# Patient Record
Sex: Female | Born: 1990 | Race: Black or African American | Hispanic: No | Marital: Single | Smoking: Current every day smoker
Health system: Southern US, Community
[De-identification: ages and names within clinical notes are randomized; demographics above are authoritative.]

## PROBLEM LIST (undated history)

## (undated) ENCOUNTER — Inpatient Hospital Stay (HOSPITAL_COMMUNITY): Payer: Self-pay

## (undated) DIAGNOSIS — R87629 Unspecified abnormal cytological findings in specimens from vagina: Secondary | ICD-10-CM

## (undated) DIAGNOSIS — D649 Anemia, unspecified: Secondary | ICD-10-CM

## (undated) HISTORY — DX: Unspecified abnormal cytological findings in specimens from vagina: R87.629

## (undated) HISTORY — PX: WISDOM TOOTH EXTRACTION: SHX21

---
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
## 2010-02-14 ENCOUNTER — Observation Stay: Payer: Self-pay | Admitting: Obstetrics and Gynecology

## 2010-02-16 ENCOUNTER — Inpatient Hospital Stay (HOSPITAL_COMMUNITY)
Admission: AD | Admit: 2010-02-16 | Discharge: 2010-02-18 | Payer: Self-pay | Source: Home / Self Care | Attending: Obstetrics and Gynecology | Admitting: Obstetrics and Gynecology

## 2010-05-04 LAB — CBC
HCT: 26.5 % — ABNORMAL LOW (ref 36.0–46.0)
Hemoglobin: 8.2 g/dL — ABNORMAL LOW (ref 12.0–15.0)
MCH: 23.8 pg — ABNORMAL LOW (ref 26.0–34.0)
MCHC: 30.9 g/dL (ref 30.0–36.0)
MCV: 76.8 fL — ABNORMAL LOW (ref 78.0–100.0)
Platelets: 212 10*3/uL (ref 150–400)
RBC: 3.45 MIL/uL — ABNORMAL LOW (ref 3.87–5.11)
RDW: 17.4 % — ABNORMAL HIGH (ref 11.5–15.5)
WBC: 9.3 10*3/uL (ref 4.0–10.5)

## 2010-05-04 LAB — RPR: RPR Ser Ql: NONREACTIVE

## 2010-05-17 LAB — URINALYSIS, ROUTINE W REFLEX MICROSCOPIC
Bilirubin Urine: NEGATIVE
Glucose, UA: 100 mg/dL — AB
Nitrite: NEGATIVE
Protein, ur: 100 mg/dL — AB
Specific Gravity, Urine: >1.03 — ABNORMAL HIGH (ref 1.005–1.030)
Urobilinogen, UA: 0.2 mg/dL (ref 0.0–1.0)
pH: 7 (ref 5.0–8.0)

## 2010-05-17 LAB — URINE MICROSCOPIC-ADD ON

## 2010-05-17 LAB — PREGNANCY, URINE: Preg Test, Ur: NEGATIVE

## 2010-10-20 ENCOUNTER — Emergency Department (HOSPITAL_COMMUNITY)
Admission: EM | Admit: 2010-10-20 | Discharge: 2010-10-20 | Disposition: A | Discharge status: Home / Self Care | Payer: Medicaid Other | Attending: Emergency Medicine | Admitting: Emergency Medicine

## 2010-10-20 DIAGNOSIS — F17200 Nicotine dependence, unspecified, uncomplicated: Secondary | ICD-10-CM | POA: Insufficient documentation

## 2010-10-20 DIAGNOSIS — H109 Unspecified conjunctivitis: Secondary | ICD-10-CM | POA: Insufficient documentation

## 2010-10-20 MED ORDER — HYDROCODONE-ACETAMINOPHEN 5-325 MG PO TABS: ORAL_TABLET | ORAL | Status: DC

## 2010-10-20 MED ORDER — TOBRAMYCIN 0.3 % OP SOLN
2.0000 [drp] | Freq: Four times a day (QID) | OPHTHALMIC | Status: DC
  Administered 2010-10-20: 2 [drp] via OPHTHALMIC
  Filled 2010-10-20: qty 5

## 2010-10-20 NOTE — ED Provider Notes (Signed)
Medical screening examination/treatment/procedure(s) were performed by non-physician practitioner and as supervising physician I was immediately available for consultation/collaboration.   Kathleen M McManus, DO 10/20/10 2211 

## 2010-10-20 NOTE — ED Notes (Signed)
Pain and swelling in left eye

## 2010-10-20 NOTE — ED Provider Notes (Signed)
History     CSN: [id number] Arrival date & time: 10/20/2010  6:05 PM  Chief Complaint  Patient presents with  . Eye Problem   Patient is a 20 y.o. female presenting with eye problem. The history is provided by the patient. No language interpreter was used.  Eye Problem  This is a new problem. The problem occurs constantly. The problem has been gradually worsening. There is pain in the left eye. There was no injury mechanism. The pain is mild. There is no history of trauma to the eye. There is no known exposure to pink eye. She does not wear contacts. Associated symptoms include blurred vision, discharge and eye redness.    History reviewed. No pertinent past medical history.  History reviewed. No pertinent past surgical history.  History reviewed. No pertinent family history.  History  Substance Use Topics  . Smoking status: Current Everyday Smoker  . Smokeless tobacco: Not on file  . Alcohol Use: No    OB History    Grav Para Term Preterm Abortions TAB SAB Ect Mult Living                  Review of Systems  Constitutional: Positive for fever.  Eyes: Positive for blurred vision, discharge and redness.  All other systems reviewed and are negative.    Physical Exam  BP 105/62  Pulse 82  Temp(Src) 98.7 F (37.1 C) (Oral)  Resp 16  Ht 5\' 6"  (1.676 m)  Wt 155 lb (70.308 kg)  BMI 25.02 kg/m2  SpO2 100%  LMP 09/30/2010  Physical Exam  Nursing note and vitals reviewed. Constitutional: She is oriented to person, place, and time. Vital signs are normal. She appears well-developed and well-nourished. No distress.  HENT:  Head: Normocephalic and atraumatic.  Right Ear: External ear normal.  Left Ear: External ear normal.  Nose: Nose normal.  Mouth/Throat: No oropharyngeal exudate.  Eyes: EOM are normal. Pupils are equal, round, and reactive to light. Right eye exhibits no discharge. Left eye exhibits discharge. No scleral icterus.       Scleral injection in L eye.    Neck: Normal range of motion. Neck supple. No JVD present. No tracheal deviation present. No thyromegaly present.  Cardiovascular: Normal rate, regular rhythm, normal heart sounds, intact distal pulses and normal pulses.  Exam reveals no gallop and no friction rub.   No murmur heard. Pulmonary/Chest: Effort normal and breath sounds normal. No stridor. No respiratory distress. She has no wheezes. She has no rales. She exhibits no tenderness.  Abdominal: Soft. Normal appearance and bowel sounds are normal. She exhibits no distension and no mass. There is no tenderness. There is no rebound and no guarding.  Musculoskeletal: Normal range of motion. She exhibits no edema and no tenderness.  Lymphadenopathy:    She has no cervical adenopathy.  Neurological: She is alert and oriented to person, place, and time. She has normal reflexes. No cranial nerve deficit. Coordination normal. GCS eye subscore is 4. GCS verbal subscore is 5. GCS motor subscore is 6.  Skin: Skin is warm and dry. No rash noted. She is not diaphoretic.  Psychiatric: She has a normal mood and affect. Her speech is normal and behavior is normal. Judgment and thought content normal. Cognition and memory are normal.    ED Course  Procedures  MDM no R eye involvement.      Worthy Rancher, PA 10/20/10 [phone]

## 2010-11-18 LAB — CBC
HCT: 33.9 — ABNORMAL LOW
MCHC: 34.6
MCV: 85.8
RBC: 3.95
WBC: 4.9

## 2010-11-18 LAB — DIFFERENTIAL
Basophils Absolute: 0.1
Eosinophils Relative: 1
Lymphocytes Relative: 45
Lymphs Abs: 2.2
Neutro Abs: 1.9
Neutrophils Relative %: 39 — ABNORMAL LOW

## 2010-11-18 LAB — URINALYSIS, ROUTINE W REFLEX MICROSCOPIC
Bilirubin Urine: NEGATIVE
Glucose, UA: NEGATIVE
Hgb urine dipstick: NEGATIVE
Specific Gravity, Urine: 1.02
Urobilinogen, UA: 0.2

## 2010-11-18 LAB — COMPREHENSIVE METABOLIC PANEL
BUN: 7
CO2: 27
Calcium: 9.4
Chloride: 105
Creatinine, Ser: 0.74
Glucose, Bld: 100 — ABNORMAL HIGH
Total Bilirubin: 0.6

## 2010-11-18 LAB — LIPASE, BLOOD: Lipase: 20

## 2011-08-28 ENCOUNTER — Encounter (HOSPITAL_COMMUNITY): Payer: Self-pay | Admitting: *Deleted

## 2011-08-28 ENCOUNTER — Emergency Department (HOSPITAL_COMMUNITY)
Admission: EM | Admit: 2011-08-28 | Discharge: 2011-08-28 | Disposition: A | Discharge status: Home / Self Care | Payer: Medicaid Other | Attending: Emergency Medicine | Admitting: Emergency Medicine

## 2011-08-28 ENCOUNTER — Emergency Department (HOSPITAL_COMMUNITY): Payer: Medicaid Other

## 2011-08-28 DIAGNOSIS — S46912A Strain of unspecified muscle, fascia and tendon at shoulder and upper arm level, left arm, initial encounter: Secondary | ICD-10-CM

## 2011-08-28 DIAGNOSIS — M25519 Pain in unspecified shoulder: Secondary | ICD-10-CM | POA: Insufficient documentation

## 2011-08-28 DIAGNOSIS — F17200 Nicotine dependence, unspecified, uncomplicated: Secondary | ICD-10-CM | POA: Insufficient documentation

## 2011-08-28 MED ORDER — HYDROCODONE-ACETAMINOPHEN 5-325 MG PO TABS: 1.0000 | ORAL_TABLET | Freq: Four times a day (QID) | ORAL | Status: AC | PRN

## 2011-08-28 MED ORDER — HYDROCODONE-ACETAMINOPHEN 5-325 MG PO TABS: 20.0000 | ORAL_TABLET | Freq: Four times a day (QID) | ORAL | Status: DC | PRN

## 2011-08-28 NOTE — ED Notes (Addendum)
Pt. Presents w/c/o L posterior shoulder pain, radiating around scapula to axilla at a 6/10.  Began spontaneously on Sunday evening. Pain was a 10 at that point.  She took 3 Extra Strength Tylenol w/out relief.  Since that time however, pain has lessened but been unrelenting.  Pt. Cannot associate it w/any particular injury or movement.

## 2011-08-28 NOTE — ED Notes (Signed)
Left shoulder pain since last Sunday night. Hurst worse with certain position changes of the arm. No known injury

## 2011-08-28 NOTE — ED Notes (Signed)
Patient with no complaints at this time. Respirations even and unlabored. Skin warm/dry. Discharge instructions reviewed with patient at this time. Patient given opportunity to voice concerns/ask questions. Patient discharged at this time and left Emergency Department with steady gait.   

## 2011-08-28 NOTE — ED Provider Notes (Signed)
History     CSN: [id number]  Arrival date & time 08/28/11  1226   First MD Initiated Contact with Patient 08/28/11 1418      Chief Complaint  Patient presents with  . Shoulder Pain    (Consider location/radiation/quality/duration/timing/severity/associated sxs/prior treatment) HPI Comments: L shoulder pain x 6 days.  No known injury.    R hand dominant.  The history is provided by the patient. No language interpreter was used.    History reviewed. No pertinent past medical history.  History reviewed. No pertinent past surgical history.  No family history on file.  History  Substance Use Topics  . Smoking status: Current Everyday Smoker  . Smokeless tobacco: Not on file  . Alcohol Use: No    OB History    Grav Para Term Preterm Abortions TAB SAB Ect Mult Living                  Review of Systems  Constitutional: Negative for fever and chills.  Musculoskeletal:       Shoulder pain  Neurological: Negative for weakness and numbness.  All other systems reviewed and are negative.    Allergies  Review of patient's allergies indicates no known allergies.  Home Medications   Current Outpatient Rx  Name Route Sig Dispense Refill  . HYDROCODONE-ACETAMINOPHEN 5-325 MG PO TABS  One po q 4-6 hrs prn pain 20 tablet 0    BP 99/56  Pulse 105  Temp 98.3 F (36.8 C)  Resp 16  SpO2 100%  LMP 08/20/2011  Physical Exam  Nursing note and vitals reviewed. Constitutional: She is oriented to person, place, and time. She appears well-developed and well-nourished. No distress.  HENT:  Head: Normocephalic and atraumatic.  Eyes: EOM are normal.  Neck: Normal range of motion.  Cardiovascular: Normal rate, regular rhythm and normal heart sounds.   Pulmonary/Chest: Effort normal and breath sounds normal.  Abdominal: Soft. She exhibits no distension. There is no tenderness.  Musculoskeletal: She exhibits tenderness.       Left shoulder: She exhibits decreased range of  motion, tenderness, bony tenderness and pain. She exhibits no swelling, no effusion, no crepitus, no deformity, no laceration, no spasm and normal strength.       Arms: Neurological: She is alert and oriented to person, place, and time.  Skin: Skin is warm and dry.  Psychiatric: She has a normal mood and affect. Judgment normal.    ED Course  Procedures (including critical care time)  Labs Reviewed - No data to display No results found.   No diagnosis found.    MDM  Normal x-rays.  Sling, ice elevation.  Follow up with your PCP as needed.        Evalina Field, [place] 08/28/11 [phone]

## 2011-08-28 NOTE — ED Provider Notes (Signed)
Medical screening examination/treatment/procedure(s) were performed by non-physician practitioner and as supervising physician I was immediately available for consultation/collaboration.   Laray Anger, DO 08/28/11 2039

## 2013-03-01 ENCOUNTER — Emergency Department (HOSPITAL_COMMUNITY)
Admission: EM | Admit: 2013-03-01 | Discharge: 2013-03-01 | Disposition: A | Discharge status: Home / Self Care | Payer: Medicaid Other | Attending: Emergency Medicine | Admitting: Emergency Medicine

## 2013-03-01 ENCOUNTER — Encounter (HOSPITAL_COMMUNITY): Payer: Self-pay | Admitting: Emergency Medicine

## 2013-03-01 DIAGNOSIS — F17200 Nicotine dependence, unspecified, uncomplicated: Secondary | ICD-10-CM | POA: Insufficient documentation

## 2013-03-01 DIAGNOSIS — IMO0001 Reserved for inherently not codable concepts without codable children: Secondary | ICD-10-CM | POA: Insufficient documentation

## 2013-03-01 DIAGNOSIS — J069 Acute upper respiratory infection, unspecified: Secondary | ICD-10-CM | POA: Insufficient documentation

## 2013-03-01 DIAGNOSIS — J039 Acute tonsillitis, unspecified: Secondary | ICD-10-CM | POA: Insufficient documentation

## 2013-03-01 LAB — RAPID STREP SCREEN (MED CTR MEBANE ONLY): Streptococcus, Group A Screen (Direct): NEGATIVE

## 2013-03-01 MED ORDER — MAGIC MOUTHWASH W/LIDOCAINE: 5.0000 mL | Freq: Three times a day (TID) | ORAL | Status: DC | PRN

## 2013-03-01 MED ORDER — AMOXICILLIN 500 MG PO CAPS: 500.0000 mg | ORAL_CAPSULE | Freq: Three times a day (TID) | ORAL | Status: DC

## 2013-03-01 MED ORDER — IBUPROFEN 800 MG PO TABS: 800.0000 mg | ORAL_TABLET | Freq: Three times a day (TID) | ORAL | Status: DC

## 2013-03-01 MED ORDER — IBUPROFEN 800 MG PO TABS
800.0000 mg | ORAL_TABLET | Freq: Once | ORAL | Status: AC
  Administered 2013-03-01: 800 mg via ORAL
  Filled 2013-03-01: qty 1

## 2013-03-01 MED ORDER — GUAIFENESIN-CODEINE 100-10 MG/5ML PO SOLN
10.0000 mL | Freq: Once | ORAL | Status: AC
  Administered 2013-03-01: 10 mL via ORAL
  Filled 2013-03-01: qty 10

## 2013-03-01 NOTE — Discharge Instructions (Signed)
Tonsillitis Tonsillitis is an infection of the throat. This infection causes the tonsils to become red, tender, and puffy (swollen). Tonsils are groups of tissue at the back of your throat. If bacteria caused your infection, antibiotic medicine will be given to you. Sometimes symptoms of tonsillitis can be relieved with the use of steroid medicine. If your tonsillitis is severe and happens often, you may need to get your tonsils removed (tonsillectomy). HOME CARE   Rest and sleep often.  Drink enough fluids to keep your pee (urine) clear or pale yellow.  While your throat is sore, eat soft or liquid foods like:  Soup.  Ice cream.  Instant breakfast drinks.  Eat frozen ice pops.  Gargle with a warm or cold liquid to help soothe the throat. Gargle with a water and salt mix. Mix 1/4 teaspoon of salt and 1/4 teaspoon of baking soda in 1 cup of water.  Only take medicines as told by your doctor.  If you are given medicines (antibiotics), take them as told. Finish them even if you start to feel better. GET HELP RIGHT AWAY IF:   You throw up (vomit).  You have a very bad headache.  You have a stiff neck.  You have chest pain.  You have trouble breathing or swallowing.  You have bad throat pain, drooling, or your voice changes.  You have bad pain not helped by medicine.  You cannot fully open your mouth.  You have redness, puffiness, or bad pain in the neck.  You have a fever.  You have a rash.  You cough up green, yellow-brown, or bloody fluid.  You cannot swallow liquids or food for 24 hours.  You notice that only one of your tonsils is swollen. MAKE SURE YOU:   Understand these instructions.  Will watch your condition.  Will get help right away if you are not doing well or get worse. Document Released: 07/28/2007 Document Revised: 10/11/2012 Document Reviewed: 07/28/2012 [facility] Patient Information 2014 [place], [place].  Upper Respiratory Infection, Adult An  upper respiratory infection (URI) is also known as the common cold. It is often caused by a type of germ (virus). Colds are easily spread (contagious). You can pass it to others by kissing, coughing, sneezing, or drinking out of the same glass. Usually, you get better in 1 or 2 weeks.  HOME CARE   Only take medicine as told by your doctor.  Use a warm mist humidifier or breathe in steam from a hot shower.  Drink enough water and fluids to keep your pee (urine) clear or pale yellow.  Get plenty of rest.  Return to work when your temperature is back to normal or as told by your doctor. You may use a face mask and wash your hands to stop your cold from spreading. GET HELP RIGHT AWAY IF:   After the first few days, you feel you are getting worse.  You have questions about your medicine.  You have chills, shortness of breath, or brown or red spit (mucus).  You have yellow or brown snot (nasal discharge) or pain in the face, especially when you bend forward.  You have a fever, puffy (swollen) neck, pain when you swallow, or white spots in the back of your throat.  You have a bad headache, ear pain, sinus pain, or chest pain.  You have a high-pitched whistling sound when you breathe in and out (wheezing).  You have a lasting cough or cough up blood.  You have sore  muscles or a stiff neck. MAKE SURE YOU:   Understand these instructions.  Will watch your condition.  Will get help right away if you are not doing well or get worse. Document Released: 07/28/2007 Document Revised: 05/03/2011 Document Reviewed: 06/15/2010 [facility] Patient Information 2014 [place], [place].

## 2013-03-01 NOTE — ED Provider Notes (Signed)
CSN: [id number]     Arrival date & time 03/01/13  1426 History   First MD Initiated Contact with Patient 03/01/13 1439     Chief Complaint  Patient presents with  . Sore Throat   (Consider location/radiation/quality/duration/timing/severity/associated sxs/prior Treatment) Patient is a 23 y.o. female presenting with pharyngitis. The history is provided by the patient.  Sore Throat This is a new problem. Episode onset: 2 days ago. The problem occurs constantly. The problem has been unchanged. Associated symptoms include chills, congestion, a fever, myalgias, a sore throat and swollen glands. Pertinent negatives include no abdominal pain, arthralgias, change in bowel habit, chest pain, coughing, headaches, joint swelling, nausea, neck pain, numbness, rash, urinary symptoms, vomiting or weakness. The symptoms are aggravated by swallowing. She has tried nothing for the symptoms. The treatment provided no relief.    History reviewed. No pertinent past medical history. History reviewed. No pertinent past surgical history. History reviewed. No pertinent family history. History  Substance Use Topics  . Smoking status: Current Every Day Smoker    Types: Cigarettes  . Smokeless tobacco: Not on file  . Alcohol Use: No   OB History   Grav Para Term Preterm Abortions TAB SAB Ect Mult Living                 Review of Systems  Constitutional: Positive for fever and chills. Negative for activity change and appetite change.  HENT: Positive for congestion and sore throat. Negative for rhinorrhea and voice change.   Eyes: Negative for visual disturbance.  Respiratory: Negative for cough and chest tightness.   Cardiovascular: Negative for chest pain.  Gastrointestinal: Negative for nausea, vomiting, abdominal pain and change in bowel habit.  Musculoskeletal: Positive for myalgias. Negative for arthralgias, joint swelling and neck pain.  Skin: Negative for rash.  Neurological: Negative for dizziness,  syncope, weakness, numbness and headaches.  Hematological: Positive for adenopathy.  All other systems reviewed and are negative.    Allergies  Review of patient's allergies indicates no known allergies.  Home Medications   Current Outpatient Rx  Name  Route  Sig  Dispense  Refill  . DM-Doxylamine-Acetaminophen (NYQUIL COLD & FLU PO)   Oral   Take 2 capsules by mouth every 6 (six) hours as needed. Cold symptoms         . Alum & Mag Hydroxide-Simeth (MAGIC MOUTHWASH W/LIDOCAINE) SOLN   Oral   Take 5 mLs by mouth 3 (three) times daily as needed for mouth pain. Swish and spit , do not swallow   100 mL   0   . amoxicillin (AMOXIL) 500 MG capsule   Oral   Take 1 capsule (500 mg total) by mouth 3 (three) times daily. For 10 days   30 capsule   0   . ibuprofen (ADVIL,MOTRIN) 800 MG tablet   Oral   Take 1 tablet (800 mg total) by mouth 3 (three) times daily.   21 tablet   0    BP 108/69  Pulse 105  Temp(Src) 98.2 F (36.8 C) (Oral)  Resp 18  Ht 5\' 6"  (1.676 m)  Wt 125 lb (56.7 kg)  BMI 20.19 kg/m2  SpO2 97%  LMP 02/17/2013   Physical Exam  Nursing note and vitals reviewed. Constitutional: She is oriented to person, place, and time. She appears well-developed and well-nourished. No distress.  HENT:  Head: Normocephalic and atraumatic.  Right Ear: Tympanic membrane and ear canal normal.  Left Ear: Tympanic membrane and ear canal normal.  Nose: Mucosal edema and rhinorrhea present.  Mouth/Throat: Uvula is midline and mucous membranes are normal. No uvula swelling. Posterior oropharyngeal edema and posterior oropharyngeal erythema present. No tonsillar abscesses.  Several scattered vesicular lesions to the oropharynx. Mild erythema.  Uvula midline.  No PTA  Neck: Normal range of motion. Neck supple.  Cardiovascular: Normal rate, regular rhythm, normal heart sounds and intact distal pulses.   No murmur heard. Pulmonary/Chest: Effort normal and breath sounds normal.  No respiratory distress. She has no wheezes. She has no rales. She exhibits no tenderness.  Musculoskeletal: Normal range of motion. She exhibits no edema.  Lymphadenopathy:    She has cervical adenopathy.  Neurological: She is alert and oriented to person, place, and time. She exhibits normal muscle tone. Coordination normal.  Skin: Skin is warm and dry. No rash noted.    ED Course  Procedures (including critical care time) Labs Review Labs Reviewed  RAPID STREP SCREEN  CULTURE, GROUP A STREP   Imaging Review No results found.  EKG Interpretation   None       MDM   1. Tonsillitis   2. URI (upper respiratory infection)    Pt is well appearing, handles secretions well.  No PTA.  Will treat for tonsillitis with amoxil, ibuprofen and magic mouthwash.  Pt agrees to care plan and appears stable for discharge.      Myasia Sinatra L. Trisha Mangle, PA-C 03/01/13 1604

## 2013-03-01 NOTE — ED Notes (Signed)
Cough, np,  Sore throat, nasal congestion.  No NVD

## 2013-03-03 LAB — CULTURE, GROUP A STREP

## 2013-03-10 NOTE — ED Provider Notes (Signed)
Medical screening examination/treatment/procedure(s) were performed by non-physician practitioner and as supervising physician I was immediately available for consultation/collaboration.  EKG Interpretation   None         Angelino Rumery, MD 03/10/13 0652 

## 2013-05-27 ENCOUNTER — Emergency Department (HOSPITAL_COMMUNITY)
Admission: EM | Admit: 2013-05-27 | Discharge: 2013-05-27 | Disposition: A | Discharge status: Home / Self Care | Payer: Medicaid Other | Attending: Emergency Medicine | Admitting: Emergency Medicine

## 2013-05-27 ENCOUNTER — Encounter (HOSPITAL_COMMUNITY): Payer: Self-pay | Admitting: Emergency Medicine

## 2013-05-27 DIAGNOSIS — H109 Unspecified conjunctivitis: Secondary | ICD-10-CM | POA: Insufficient documentation

## 2013-05-27 DIAGNOSIS — F17200 Nicotine dependence, unspecified, uncomplicated: Secondary | ICD-10-CM | POA: Insufficient documentation

## 2013-05-27 DIAGNOSIS — H1032 Unspecified acute conjunctivitis, left eye: Secondary | ICD-10-CM

## 2013-05-27 MED ORDER — TOBRAMYCIN 0.3 % OP SOLN
1.0000 [drp] | Freq: Once | OPHTHALMIC | Status: AC
  Administered 2013-05-27: 1 [drp] via OPHTHALMIC
  Filled 2013-05-27: qty 5

## 2013-05-27 MED ORDER — TETRACAINE HCL 0.5 % OP SOLN
2.0000 [drp] | Freq: Once | OPHTHALMIC | Status: AC
  Administered 2013-05-27: 2 [drp] via OPHTHALMIC
  Filled 2013-05-27: qty 2

## 2013-05-27 NOTE — ED Notes (Signed)
Julie PA at bedside,  

## 2013-05-27 NOTE — ED Provider Notes (Signed)
CSN: [id number]     Arrival date & time 05/27/13  1325 History  This chart was scribed for non-physician practitioner, Burgess AmorJulie Emily Massar, PA-C,working with Juliet RudeNathan R. Rubin PayorPickering, MD, by Karle PlumberJennifer Tensley, ED Scribe.  This patient was seen in room APFT20/APFT20 and the patient's care was started at 2:44 PM.  Chief Complaint  Patient presents with  . Eye Pain   The history is provided by the patient. No language interpreter was used.   HPI Comments:  Cassandra Berg is a 23 y.o. female who presents to the Emergency Department complaining gradually worsening left eye swelling that started four days ago upon waking. Pt reports associated burning, itching, and pain of the left eye that started yesterday. She has tearing of the left eye without thick drainage.  She denies photophobia.  She reports mild rhinorrhea. She reports using Clear Eyes opthalmic drops with mild relief. Pt denies any sick contacts of conjunctivitis. She denies any injury to the eye or fever. She states she wears corrective lenses but denies wearing contact lenses.   History reviewed. No pertinent past medical history. History reviewed. No pertinent past surgical history. No family history on file. History  Substance Use Topics  . Smoking status: Current Every Day Smoker    Types: Cigarettes  . Smokeless tobacco: Not on file  . Alcohol Use: No   OB History   Grav Para Term Preterm Abortions TAB SAB Ect Mult Living                 Review of Systems  Constitutional: Negative for fever, chills and fatigue.  HENT: Negative for sore throat and trouble swallowing.   Eyes: Positive for pain.  Respiratory: Negative for cough, shortness of breath and wheezing.   Cardiovascular: Negative for chest pain and palpitations.  Gastrointestinal: Negative for nausea, vomiting, abdominal pain and blood in stool.  Genitourinary: Negative for dysuria, hematuria and flank pain.  Musculoskeletal: Negative for arthralgias, back pain, myalgias, neck  pain and neck stiffness.  Skin: Negative for rash.  Neurological: Negative for dizziness, weakness and numbness.  Hematological: Does not bruise/bleed easily.    Allergies  Review of patient's allergies indicates no known allergies.  Home Medications   Current Outpatient Rx  Name  Route  Sig  Dispense  Refill  . IRON PO   Oral   Take 1 tablet by mouth daily.         . naphazoline-glycerin (CLEAR EYES) 0.012-0.2 % SOLN   Left Eye   Place 2 drops into the left eye 3 (three) times daily as needed for irritation.          Triage Vitals: BP 114/73  Pulse 62  Temp(Src) 98.3 F (36.8 C) (Oral)  Resp 18  Ht 5\' 6"  (1.676 m)  Wt 145 lb (65.772 kg)  BMI 23.41 kg/m2  SpO2 100%  LMP 05/19/2013 Physical Exam  Nursing note and vitals reviewed. Constitutional: She appears well-developed and well-nourished.  HENT:  Head: Normocephalic and atraumatic.  Eyes: EOM and lids are normal. Pupils are equal, round, and reactive to light. Left conjunctiva is injected.  Slit lamp exam:      The left eye shows no corneal abrasion, no corneal flare, no corneal ulcer, no foreign body, no fluorescein uptake and no anterior chamber bulge.  Cobblestoning of palpebral conjunctivae. Clear tear production. Left eye pressures 12 and 14. Complete pain relief with application of tetracaine. Visual Acuity - Bilateral Near: 20/25 ; Bilateral Distance: 20/25 ; R Distance: 20/25 ;  L Near: 20/30 ; L Distance: 20/30  No consensual eye pain to light.  Neck: Normal range of motion.  Cardiovascular: Normal rate, regular rhythm, normal heart sounds and intact distal pulses.   Pulmonary/Chest: Effort normal and breath sounds normal. She has no wheezes.  Abdominal: Soft. Bowel sounds are normal. There is no tenderness.  Musculoskeletal: Normal range of motion.  Neurological: She is alert.  Skin: Skin is warm and dry.  Psychiatric: She has a normal mood and affect.    ED Course  Procedures (including critical  care time) DIAGNOSTIC STUDIES: Oxygen Saturation is 100% on RA, normal by my interpretation.   COORDINATION OF CARE: 2:50 PM- Will instill tetracaine drops and use fluorescein dye to check for abrasion. Pt verbalizes understanding and agrees to plan.  Medications  tetracaine (PONTOCAINE) 0.5 % ophthalmic solution 2 drop (2 drops Left Eye Given by Other 05/27/13 1538)  tobramycin (TOBREX) 0.3 % ophthalmic solution 1 drop (1 drop Both Eyes Given 05/27/13 1538)    Labs Review Labs Reviewed - No data to display Imaging Review No results found.   EKG Interpretation None      MDM   Final diagnoses:  Conjunctivitis, acute, left eye    Pt was given tobrex ophthalmic solution for tx of conjunctivitis.  Encouraged recheck by her ophthalmologist if not improving over the next 2 days,  She sees Dr Lita Mains.    I personally performed the services described in this documentation, which was scribed in my presence. The recorded information has been reviewed and is accurate.    Burgess Amor, PA-C 05/28/13 [id number]  Burgess Amor, PA-C 05/28/13 [phone]

## 2013-05-27 NOTE — ED Notes (Signed)
Pt c/o left eye redness, watering, itching, pain since Wednesday, pt alert, able to answer questions, clear drainage noted from left eye, redness noted to left eye,

## 2013-05-27 NOTE — Discharge Instructions (Signed)
Conjunctivitis Conjunctivitis is commonly called "pink eye." Conjunctivitis can be caused by bacterial or viral infection, allergies, or injuries. There is usually redness of the lining of the eye, itching, discomfort, and sometimes discharge. There may be deposits of matter along the eyelids. A viral infection usually causes a watery discharge, while a bacterial infection causes a yellowish, thick discharge. Pink eye is very contagious and spreads by direct contact. You may be given antibiotic eyedrops as part of your treatment. Before using your eye medicine, remove all drainage from the eye by washing gently with warm water and cotton balls. Continue to use the medication until you have awakened 2 mornings in a row without discharge from the eye. Do not rub your eye. This increases the irritation and helps spread infection. Use separate towels from other household members. Wash your hands with soap and water before and after touching your eyes. Use cold compresses to reduce pain and sunglasses to relieve irritation from light. Do not wear contact lenses or wear eye makeup until the infection is gone. SEEK MEDICAL CARE IF:   Your symptoms are not better after 3 days of treatment.  You have increased pain or trouble seeing.  The outer eyelids become very red or swollen. Document Released: 03/18/2004 Document Revised: 05/03/2011 Document Reviewed: 02/08/2005 [facility] Patient Information 2014 [place], [place].  Apply 1 drop of the antibiotic to both eyes every 4 hours while awake for the next 7 days.  Use warm compresses if you wake with your eyelids sealed closed.  You may also use a cool compress during the day which can help with swelling.  Wash your hands frequently as discussed.

## 2013-05-27 NOTE — ED Notes (Signed)
Pt c/o redness, itching, and pain to left eye since Wednesday.  Says vision is blurry.

## 2013-05-29 NOTE — ED Provider Notes (Signed)
Medical screening examination/treatment/procedure(s) were performed by non-physician practitioner and as supervising physician I was immediately available for consultation/collaboration.   EKG Interpretation None       Juliet RudeNathan R. Rubin PayorPickering, MD 05/29/13 1510

## 2013-06-08 ENCOUNTER — Encounter (HOSPITAL_COMMUNITY): Payer: Self-pay | Admitting: Emergency Medicine

## 2013-06-08 ENCOUNTER — Emergency Department (HOSPITAL_COMMUNITY)
Admission: EM | Admit: 2013-06-08 | Discharge: 2013-06-08 | Disposition: A | Discharge status: Home / Self Care | Payer: Medicaid Other | Attending: Emergency Medicine | Admitting: Emergency Medicine

## 2013-06-08 DIAGNOSIS — B009 Herpesviral infection, unspecified: Secondary | ICD-10-CM | POA: Insufficient documentation

## 2013-06-08 DIAGNOSIS — B001 Herpesviral vesicular dermatitis: Secondary | ICD-10-CM

## 2013-06-08 DIAGNOSIS — F17200 Nicotine dependence, unspecified, uncomplicated: Secondary | ICD-10-CM | POA: Insufficient documentation

## 2013-06-08 NOTE — ED Provider Notes (Signed)
Medical screening examination/treatment/procedure(s) were performed by non-physician practitioner and as supervising physician I was immediately available for consultation/collaboration.   EKG Interpretation None        Ajax Schroll M Sajjad Honea, DO 06/08/13 1556 

## 2013-06-08 NOTE — ED Provider Notes (Signed)
CSN: [id number]     Arrival date & time 06/08/13  1120 History   First MD Initiated Contact with Patient 06/08/13 1146     Chief Complaint  Patient presents with  . Oral Swelling     (Consider location/radiation/quality/duration/timing/severity/associated sxs/prior Treatment) HPI Comments: Patient is a 23 year old female who presents to the emergency department with complaint of swelling and pain of the upper lip. The patient states that she is noticing increasing problems with swelling of the lip for the past 2 days. She states that she has had some pink I. about a week ago, she thinks she may have had some body aches in between that time but has not had any other symptoms. In the last 2 days she has not had any difficulty swallowing, difficulty breathing, swelling of any other portions of the face. She presents at this time for additional evaluation of this swelling of her lip.  The history is provided by the patient.    History reviewed. No pertinent past medical history. History reviewed. No pertinent past surgical history. No family history on file. History  Substance Use Topics  . Smoking status: Current Every Day Smoker    Types: Cigarettes  . Smokeless tobacco: Not on file  . Alcohol Use: No   OB History   Grav Para Term Preterm Abortions TAB SAB Ect Mult Living                 Review of Systems  Constitutional: Negative for activity change.       All ROS Neg except as noted in HPI  HENT: Negative for nosebleeds.   Eyes: Negative for photophobia and discharge.  Respiratory: Negative for cough, shortness of breath and wheezing.   Cardiovascular: Negative for chest pain and palpitations.  Gastrointestinal: Negative for abdominal pain and blood in stool.  Genitourinary: Negative for dysuria, frequency and hematuria.  Musculoskeletal: Negative for arthralgias, back pain and neck pain.  Skin: Negative.   Neurological: Negative for dizziness, seizures and speech difficulty.   Psychiatric/Behavioral: Negative for hallucinations and confusion.      Allergies  Review of patient's allergies indicates no known allergies.  Home Medications   Prior to Admission medications   Medication Sig Start Date End Date Taking? Authorizing Provider  Melatonin-Pyridoxine (MELATIN PO) Take 1 tablet by mouth daily as needed (sleep).   Yes Historical Provider, MD   BP 121/74  Pulse 100  Temp(Src) 98.6 F (37 C) (Oral)  Resp 20  Ht 5\' 6"  (1.676 m)  Wt 145 lb (65.772 kg)  BMI 23.41 kg/m2  SpO2 100%  LMP 05/19/2013 Physical Exam  Nursing note and vitals reviewed. Constitutional: She is oriented to person, place, and time. She appears well-developed and well-nourished.  Non-toxic appearance.  HENT:  Head: Normocephalic.  Right Ear: Tympanic membrane and external ear normal.  Left Ear: Tympanic membrane and external ear normal.  There is mild swelling of the upper lip left greater right upper lip. There is a blister present with fluid and pus like material present, consistent with a cold sore. The area is quite tender. No other lesions appreciated. The airway is patent. The speech is clear and understandable.  Eyes: EOM and lids are normal. Pupils are equal, round, and reactive to light.  Neck: Normal range of motion. Neck supple. Carotid bruit is not present.  Cardiovascular: Normal rate, regular rhythm, normal heart sounds, intact distal pulses and normal pulses.   Pulmonary/Chest: Breath sounds normal. No respiratory distress.  Abdominal: Soft. Bowel  sounds are normal. There is no tenderness. There is no guarding.  Musculoskeletal: Normal range of motion.  Lymphadenopathy:       Head (right side): No submandibular adenopathy present.       Head (left side): No submandibular adenopathy present.    She has no cervical adenopathy.  Neurological: She is alert and oriented to person, place, and time. She has normal strength. No cranial nerve deficit or sensory deficit.   Skin: Skin is warm and dry.  Psychiatric: She has a normal mood and affect. Her speech is normal.    ED Course  Procedures (including critical care time) Labs Review Labs Reviewed - No data to display  Imaging Review No results found.   EKG Interpretation None      MDM Patient presents with 2 days of swelling of the upper lip. 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 Patient is advised of the findings. She is advised to use Orajel, Tylenol, and ibuprofen for soreness. She has been warned of the contagious nature of these lesions.    Final diagnoses:  Cold sore    **I have reviewed nursing notes, vital signs, and all appropriate lab and imaging results for this patient.Kathie Dike*    Beverly Suriano M Finn Altemose, PA-C 06/08/13 1300

## 2013-06-08 NOTE — ED Notes (Addendum)
Pt says upper lip feels "irritated" for 2 days.  No  resp distress, or trouble swallowing.

## 2013-06-08 NOTE — Discharge Instructions (Signed)
Cold Sore PLEASE WASH HANDS FREQUENTLY. APPLY ORAGEL FOR PAIN. THIS IS CONTAGIOUS, KEEP YOUR DISTANCE FOR PEOPLE. A cold sore (fever blister) is a skin infection caused by a certain type of germ (virus). They are small sores filled with fluid that dry up and heal within 2 weeks. Cold sores form inside of the mouth or on the lips, gums, and other parts of the body. Cold sores can be easily passed (contagious) to other people. This can happen through close personal contact, such as kissing or sharing a drinking glass. HOME CARE  Only take medicine as told by your doctor. Do not use aspirin.  Use a cotton-tip swab to put creams or gels on your sores.  Do not touch sores or pick scabs. Wash your hands often. Do not touch your eyes without washing your hands first.  Avoid kissing, oral sex, and sharing personal items until the sores heal.  Put an ice pack on your sores for 10 15 minutes to ease discomfort.  Avoid hot, cold, or salty foods. Eat a soft, bland diet. Use a straw to drink if it helps lessen pain.  Keep sores clean and dry.  Avoid the sun and limit stress if these things cause you to have sores. Apply sunscreen on your lips if the sun causes cold sores. GET HELP IF:  You have a fever or lasting symptoms for more than 2 3 days.  You have a fever and your symptoms suddenly get worse.  You have yellow-white fluid (not clear) coming from the sores.  You have redness that is spreading.  You have pain or irritation in your eye.  You get sores on your genitals.  Your sores do not heal within 2 weeks.  You have a tough time fighting off sickness and infections (weakened immune system).  You get cold sores often. MAKE SURE YOU:   Understand these instructions.  Will watch your condition.  Will get help right away if you are not doing well or get worse. Document Released: 08/10/2011 Document Reviewed: 08/10/2011 [facility] Patient Information 2014 [place]Care, [place].

## 2013-06-08 NOTE — ED Notes (Signed)
Top lip swelling for the past 2 days, denies any shortness of breath or itching

## 2013-09-13 ENCOUNTER — Encounter (HOSPITAL_COMMUNITY): Payer: Self-pay | Admitting: Emergency Medicine

## 2013-09-13 ENCOUNTER — Emergency Department (HOSPITAL_COMMUNITY)
Admission: EM | Admit: 2013-09-13 | Discharge: 2013-09-13 | Disposition: A | Discharge status: Home / Self Care | Payer: Medicaid Other | Attending: Emergency Medicine | Admitting: Emergency Medicine

## 2013-09-13 DIAGNOSIS — N949 Unspecified condition associated with female genital organs and menstrual cycle: Secondary | ICD-10-CM | POA: Diagnosis not present

## 2013-09-13 DIAGNOSIS — Z3202 Encounter for pregnancy test, result negative: Secondary | ICD-10-CM | POA: Diagnosis not present

## 2013-09-13 DIAGNOSIS — R109 Unspecified abdominal pain: Secondary | ICD-10-CM | POA: Insufficient documentation

## 2013-09-13 DIAGNOSIS — B9689 Other specified bacterial agents as the cause of diseases classified elsewhere: Secondary | ICD-10-CM | POA: Insufficient documentation

## 2013-09-13 DIAGNOSIS — F17200 Nicotine dependence, unspecified, uncomplicated: Secondary | ICD-10-CM | POA: Diagnosis not present

## 2013-09-13 DIAGNOSIS — A499 Bacterial infection, unspecified: Secondary | ICD-10-CM | POA: Insufficient documentation

## 2013-09-13 DIAGNOSIS — N760 Acute vaginitis: Secondary | ICD-10-CM | POA: Insufficient documentation

## 2013-09-13 DIAGNOSIS — R102 Pelvic and perineal pain: Secondary | ICD-10-CM

## 2013-09-13 HISTORY — DX: Anemia, unspecified: D64.9

## 2013-09-13 LAB — URINALYSIS, ROUTINE W REFLEX MICROSCOPIC
Bilirubin Urine: NEGATIVE
Glucose, UA: NEGATIVE mg/dL
Hgb urine dipstick: NEGATIVE
KETONES UR: NEGATIVE mg/dL
LEUKOCYTES UA: NEGATIVE
NITRITE: NEGATIVE
PH: 7.5 (ref 5.0–8.0)
Protein, ur: NEGATIVE mg/dL
SPECIFIC GRAVITY, URINE: 1.01 (ref 1.005–1.030)
UROBILINOGEN UA: 0.2 mg/dL (ref 0.0–1.0)

## 2013-09-13 LAB — CBC WITH DIFFERENTIAL/PLATELET
BASOS PCT: 1 % (ref 0–1)
Basophils Absolute: 0.1 10*3/uL (ref 0.0–0.1)
EOS PCT: 1 % (ref 0–5)
Eosinophils Absolute: 0.1 10*3/uL (ref 0.0–0.7)
HCT: 31.2 % — ABNORMAL LOW (ref 36.0–46.0)
HEMOGLOBIN: 10.6 g/dL — AB (ref 12.0–15.0)
LYMPHS ABS: 2.8 10*3/uL (ref 0.7–4.0)
Lymphocytes Relative: 43 % (ref 12–46)
MCH: 29 pg (ref 26.0–34.0)
MCHC: 34 g/dL (ref 30.0–36.0)
MCV: 85.5 fL (ref 78.0–100.0)
Monocytes Absolute: 0.6 10*3/uL (ref 0.1–1.0)
Monocytes Relative: 9 % (ref 3–12)
NEUTROS PCT: 46 % (ref 43–77)
Neutro Abs: 2.8 10*3/uL (ref 1.7–7.7)
Platelets: 324 10*3/uL (ref 150–400)
RBC: 3.65 MIL/uL — AB (ref 3.87–5.11)
RDW: 16.4 % — ABNORMAL HIGH (ref 11.5–15.5)
WBC: 6.4 10*3/uL (ref 4.0–10.5)

## 2013-09-13 LAB — BASIC METABOLIC PANEL
Anion gap: 10 (ref 5–15)
BUN: 12 mg/dL (ref 6–23)
CHLORIDE: 104 meq/L (ref 96–112)
CO2: 27 mEq/L (ref 19–32)
Calcium: 9.6 mg/dL (ref 8.4–10.5)
Creatinine, Ser: 0.77 mg/dL (ref 0.50–1.10)
Glucose, Bld: 89 mg/dL (ref 70–99)
POTASSIUM: 4.5 meq/L (ref 3.7–5.3)
Sodium: 141 mEq/L (ref 137–147)

## 2013-09-13 LAB — POC URINE PREG, ED: PREG TEST UR: NEGATIVE

## 2013-09-13 LAB — WET PREP, GENITAL
Trich, Wet Prep: NONE SEEN
YEAST WET PREP: NONE SEEN

## 2013-09-13 MED ORDER — HYDROCODONE-ACETAMINOPHEN 5-325 MG PO TABS: 2.0000 | ORAL_TABLET | ORAL | Status: DC | PRN

## 2013-09-13 MED ORDER — METRONIDAZOLE 500 MG PO TABS: 500.0000 mg | ORAL_TABLET | Freq: Two times a day (BID) | ORAL | Status: DC

## 2013-09-13 MED ORDER — IBUPROFEN 800 MG PO TABS: 800.0000 mg | ORAL_TABLET | Freq: Three times a day (TID) | ORAL | Status: DC

## 2013-09-13 NOTE — ED Notes (Addendum)
Pt reports lower pelvic pain with ambulation and movement. Pt denies any vaginal bleeding,discharge, or urinary symptoms. Pt reports abdominal bloating prior to onset of pain. Pt denies any known injury/fall.

## 2013-09-13 NOTE — Discharge Instructions (Signed)
Bacterial Vaginosis °Bacterial vaginosis is an infection of the vagina. It happens when too many of certain germs (bacteria) grow in the vagina. °HOME CARE °· Take your medicine as told by your doctor. °· Finish your medicine even if you start to feel better. °· Do not have sex until you finish your medicine and are better. °· Tell your sex partner that you have an infection. They should see their doctor for treatment. °· Practice safe sex. Use condoms. Have only one sex partner. °GET HELP IF: °· You are not getting better after 3 days of treatment. °· You have more grey fluid (discharge) coming from your vagina than before. °· You have more pain than before. °· You have a fever. °MAKE SURE YOU:  °· Understand these instructions. °· Will watch your condition. °· Will get help right away if you are not doing well or get worse. °Document Released: 11/18/2007 Document Revised: 11/29/2012 Document Reviewed: 09/20/2012 °ExitCare® Patient Information ©2015 [employer]. This information is not intended to replace advice given to you by your health care provider. Make sure you discuss any questions you have with your health care provider. ° °Pelvic Pain °Pelvic pain is pain felt below the belly button and between your hips. It can be caused by many different things. It is important to get help right away. This is especially true for severe, sharp, or unusual pain that comes on suddenly.  °HOME CARE °· Only take medicine as told by your doctor. °· Rest as told by your doctor. °· Eat a healthy diet, such as fruits, vegetables, and lean meats. °· Drink enough fluids to keep your pee (urine) clear or pale yellow, or as told. °· Avoid sex (intercourse) if it causes pain. °· Apply warm or cold packs to your lower belly (abdomen). Use the type of pack that helps the pain. °· Avoid situations that cause you stress. °· Keep a journal to track your pain. Write down: °¨ When the pain started. °¨ Where it is located. °¨ If there are  things that seem to be related to the pain, such as food or your period. °· Follow up with your doctor as told. °GET HELP RIGHT AWAY IF:  °· You have heavy bleeding from the vagina. °· You have more pelvic pain. °· You feel lightheaded or pass out (faint). °· You have chills. °· You have pain when you pee or have blood in your pee. °· You cannot stop having watery poop (diarrhea). °· You cannot stop throwing up (vomiting). °· You have a fever or lasting symptoms for more than 3 days. °· You have a fever and your symptoms suddenly get worse. °· You are being physically or sexually abused. °· Your medicine does not help your pain. °· You have fluid (discharge) coming from your vagina that is not normal. °MAKE SURE YOU: °· Understand these instructions. °· Will watch your condition. °· Will get help if you are not doing well or get worse. °Document Released: 07/28/2007 Document Revised: 08/10/2011 Document Reviewed: 05/31/2011 °ExitCare® Patient Information ©2015 [employer]. This information is not intended to replace advice given to you by your health care provider. Make sure you discuss any questions you have with your health care provider. ° °

## 2013-09-13 NOTE — ED Provider Notes (Signed)
CSN: [id number]     Arrival date & time 09/13/13  1741 History   First MD Initiated Contact with Patient 09/13/13 1801     Chief Complaint  Patient presents with  . Abdominal Pain     (Consider location/radiation/quality/duration/timing/severity/associated sxs/prior Treatment) HPI Comments: Patient presents to ER for evaluation of abdominal and pelvic pain. Symptoms began 3 days ago. Patient reports that the pain has been present ever since. It is down in the lower abdomen and pelvis region bilaterally. It worsens when she changes positions, such as standing from a sitting position. She has not had any vaginal discharge. There is no nausea, vomiting or diarrhea. She denies urinary symptoms.  Patient is a 23 y.o. female presenting with abdominal pain.  Abdominal Pain Associated symptoms: no dysuria and no hematuria     Past Medical History  Diagnosis Date  . Anemia    History reviewed. No pertinent past surgical history. History reviewed. No pertinent family history. History  Substance Use Topics  . Smoking status: Current Some Day Smoker -- 0.25 packs/day    Types: Cigarettes  . Smokeless tobacco: Not on file  . Alcohol Use: No     Comment: socially   OB History   Grav Para Term Preterm Abortions TAB SAB Ect Mult Living                 Review of Systems  Gastrointestinal: Positive for abdominal pain.  Genitourinary: Negative for dysuria, frequency and hematuria.  All other systems reviewed and are negative.     Allergies  Review of patient's allergies indicates no known allergies.  Home Medications   Prior to Admission medications   Not on File   BP 117/72  Pulse 82  Temp(Src) 98.2 F (36.8 C) (Oral)  Resp 16  Ht 5\' 6"  (1.676 m)  Wt 125 lb (56.7 kg)  BMI 20.19 kg/m2  SpO2 100%  LMP 08/21/2013 Physical Exam  Constitutional: She is oriented to person, place, and time. She appears well-developed and well-nourished. No distress.  HENT:  Head: Normocephalic  and atraumatic.  Right Ear: Hearing normal.  Left Ear: Hearing normal.  Nose: Nose normal.  Mouth/Throat: Oropharynx is clear and moist and mucous membranes are normal.  Eyes: Conjunctivae and EOM are normal. Pupils are equal, round, and reactive to light.  Neck: Normal range of motion. Neck supple.  Cardiovascular: Regular rhythm, S1 normal and S2 normal.  Exam reveals no gallop and no friction rub.   No murmur heard. Pulmonary/Chest: Effort normal and breath sounds normal. No respiratory distress. She exhibits no tenderness.  Abdominal: Soft. Normal appearance and bowel sounds are normal. There is no hepatosplenomegaly. There is no tenderness. There is no rebound, no guarding, no tenderness at McBurney's point and negative Murphy's sign. No hernia.  Genitourinary: Vagina normal and uterus normal. There is no rash, tenderness or lesion on the right labia. There is no rash, tenderness or lesion on the left labia. Cervix exhibits no motion tenderness and no friability. Right adnexum displays tenderness and fullness. Left adnexum displays no mass, no tenderness and no fullness.  Musculoskeletal: Normal range of motion.  Neurological: She is alert and oriented to person, place, and time. She has normal strength. No cranial nerve deficit or sensory deficit. Coordination normal. GCS eye subscore is 4. GCS verbal subscore is 5. GCS motor subscore is 6.  Skin: Skin is warm, dry and intact. No rash noted. No cyanosis.  Psychiatric: She has a normal mood and affect. Her speech  is normal and behavior is normal. Thought content normal.    ED Course  Procedures (including critical care time) Labs Review Labs Reviewed  URINALYSIS, ROUTINE W REFLEX MICROSCOPIC - Abnormal; Notable for the following:    APPearance HAZY (*)    All other components within normal limits  CBC WITH DIFFERENTIAL - Abnormal; Notable for the following:    RBC 3.65 (*)    Hemoglobin 10.6 (*)    HCT 31.2 (*)    RDW 16.4 (*)     All other components within normal limits  GC/CHLAMYDIA PROBE AMP  WET PREP, GENITAL  BASIC METABOLIC PANEL  RPR  HIV ANTIBODY (ROUTINE TESTING)  POC URINE PREG, ED    Imaging Review No results found.   EKG Interpretation None      MDM   Final diagnoses:  None   pelvic pain  She presented to the ER for evaluation of pelvic pain. Pain started 3 days ago and has been continuous since its onset. Pain has been waxing and waning it does seem to change with changes in position. She is not experiencing any tenderness on palpation of the abdomen. Palpation in all 4 quadrants, including at McBurney's point, does not elicit any tenderness. No guarding or rebound. Pelvic exam, however, does reveal fullness in the area of the right adnexa without overt mass. The area is tender to touch. No cervical motion tenderness. GC and Chlamydia pending. The patient's presentation does not support a diagnosis of torsion. She'll be treated with analgesia, await cultures. Follow up with OB/GYN. Return if her symptoms worsen.    Cassandra Creasehristopher J. Sakai Wolford, MD 09/13/13 [phone]

## 2013-09-14 LAB — RPR

## 2013-09-14 LAB — HIV ANTIBODY (ROUTINE TESTING W REFLEX): HIV 1&2 Ab, 4th Generation: NONREACTIVE

## 2013-09-15 LAB — GC/CHLAMYDIA PROBE AMP
CT PROBE, AMP APTIMA: NEGATIVE
GC Probe RNA: NEGATIVE

## 2013-12-31 ENCOUNTER — Emergency Department (HOSPITAL_COMMUNITY)
Admission: EM | Admit: 2013-12-31 | Discharge: 2013-12-31 | Disposition: A | Discharge status: Home / Self Care | Payer: Medicaid Other | Attending: Emergency Medicine | Admitting: Emergency Medicine

## 2013-12-31 ENCOUNTER — Encounter (HOSPITAL_COMMUNITY): Payer: Self-pay | Admitting: Emergency Medicine

## 2013-12-31 DIAGNOSIS — R21 Rash and other nonspecific skin eruption: Secondary | ICD-10-CM | POA: Insufficient documentation

## 2013-12-31 DIAGNOSIS — L299 Pruritus, unspecified: Secondary | ICD-10-CM | POA: Insufficient documentation

## 2013-12-31 DIAGNOSIS — Z720 Tobacco use: Secondary | ICD-10-CM | POA: Diagnosis not present

## 2013-12-31 DIAGNOSIS — Z792 Long term (current) use of antibiotics: Secondary | ICD-10-CM | POA: Diagnosis not present

## 2013-12-31 DIAGNOSIS — Z862 Personal history of diseases of the blood and blood-forming organs and certain disorders involving the immune mechanism: Secondary | ICD-10-CM | POA: Insufficient documentation

## 2013-12-31 MED ORDER — TRIAMCINOLONE ACETONIDE 0.1 % EX CREA: 1.0000 "application " | TOPICAL_CREAM | Freq: Two times a day (BID) | CUTANEOUS | Status: DC

## 2013-12-31 NOTE — Discharge Instructions (Signed)
Contact Dermatitis °Contact dermatitis is a reaction to certain substances that touch the skin. Contact dermatitis can be either irritant contact dermatitis or allergic contact dermatitis. Irritant contact dermatitis does not require previous exposure to the substance for a reaction to occur. Allergic contact dermatitis only occurs if you have been exposed to the substance before. Upon a repeat exposure, your body reacts to the substance.  °CAUSES  °Many substances can cause contact dermatitis. Irritant dermatitis is most commonly caused by repeated exposure to mildly irritating substances, such as: °· Makeup. °· Soaps. °· Detergents. °· Bleaches. °· Acids. °· Metal salts, such as nickel. °Allergic contact dermatitis is most commonly caused by exposure to: °· Poisonous plants. °· Chemicals (deodorants, shampoos). °· Jewelry. °· Latex. °· Neomycin in triple antibiotic cream. °· Preservatives in products, including clothing. °SYMPTOMS  °The area of skin that is exposed may develop: °· Dryness or flaking. °· Redness. °· Cracks. °· Itching. °· Pain or a burning sensation. °· Blisters. °With allergic contact dermatitis, there may also be swelling in areas such as the eyelids, mouth, or genitals.  °DIAGNOSIS  °Your caregiver can usually tell what the problem is by doing a physical exam. In cases where the cause is uncertain and an allergic contact dermatitis is suspected, a patch skin test may be performed to help determine the cause of your dermatitis. °TREATMENT °Treatment includes protecting the skin from further contact with the irritating substance by avoiding that substance if possible. Barrier creams, powders, and gloves may be helpful. Your caregiver may also recommend: °· Steroid creams or ointments applied 2 times daily. For best results, soak the rash area in cool water for 20 minutes. Then apply the medicine. Cover the area with a plastic wrap. You can store the steroid cream in the refrigerator for a "chilly"  effect on your rash. That may decrease itching. Oral steroid medicines may be needed in more severe cases. °· Antibiotics or antibacterial ointments if a skin infection is present. °· Antihistamine lotion or an antihistamine taken by mouth to ease itching. °· Lubricants to keep moisture in your skin. °· Burow's solution to reduce redness and soreness or to dry a weeping rash. Mix one packet or tablet of solution in 2 cups cool water. Dip a clean washcloth in the mixture, wring it out a bit, and put it on the affected area. Leave the cloth in place for 30 minutes. Do this as often as possible throughout the day. °· Taking several cornstarch or baking soda baths daily if the area is too large to cover with a washcloth. °Harsh chemicals, such as alkalis or acids, can cause skin damage that is like a burn. You should flush your skin for 15 to 20 minutes with cold water after such an exposure. You should also seek immediate medical care after exposure. Bandages (dressings), antibiotics, and pain medicine may be needed for severely irritated skin.  °HOME CARE INSTRUCTIONS °· Avoid the substance that caused your reaction. °· Keep the area of skin that is affected away from hot water, soap, sunlight, chemicals, acidic substances, or anything else that would irritate your skin. °· Do not scratch the rash. Scratching may cause the rash to become infected. °· You may take cool baths to help stop the itching. °· Only take over-the-counter or prescription medicines as directed by your caregiver. °· See your caregiver for follow-up care as directed to make sure your skin is healing properly. °SEEK MEDICAL CARE IF:  °· Your condition is not better after 3   days of treatment.  You seem to be getting worse.  You see signs of infection such as swelling, tenderness, redness, soreness, or warmth in the affected area.  You have any problems related to your medicines. Document Released: 02/06/2000 Document Revised: 05/03/2011  Document Reviewed: 07/14/2010 [facility] Patient Information 2015 [place]Care, [place]LL[place]. This information is not intended to replace advice given to you by your health care provider. Make sure you discuss any questions you have with your health care provider.   Apply the steroid cream to your areas of rash twice daily as discussed.  This should be settled down over the next 10 days.  Please get rechecked if it does not or if you develop worsening symptoms.  You may take Benadryl tablets which may help with itching.  Ice packs also can help with itchy rashes as well.

## 2013-12-31 NOTE — ED Provider Notes (Signed)
CSN: [id number]     Arrival date & time 12/31/13  1227 History  This chart was scribed for non-physician practitioner Burgess AmorJulie Aronda Burford, PA-C working with Audree CamelScott T Goldston, MD by Littie Deedsichard Sun, ED Scribe. This patient was seen in room APFT20/APFT20 and the patient's care was started at 1:41 PM.    Chief Complaint  Patient presents with  . Rash     The history is provided by the patient. No language interpreter was used.   HPI Comments: Cassandra Berg is a 23 y.o. female who presents to the Emergency Department complaining of itching rashes to the left hand and left foot that began 3-4 days ago. Patient reports rash started on left hand, and her left foot rash started 2 days ago.  Patient has tried topical Benadryl but with no relief. She was stung by a bee or a wasp 2 weeks ago on her upper arm - the site of this sting has resolved. Patient denies SOB. She denies wearing rings,  Exposure to pets, recent new environment, topical exposures. 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  [facility]: [facility]  Past Medical History  Diagnosis Date  . Anemia    History reviewed. No pertinent past surgical history. History reviewed. No pertinent family history. History  Substance Use Topics  . Smoking status: Current Some Day Smoker -- 0.25 packs/day    Types: Cigarettes  . Smokeless tobacco: Not on file  . Alcohol Use: No     Comment: socially   OB History    No data available     Review of Systems  Constitutional: Negative for fever.  HENT: Negative for congestion and sore throat.   Eyes: Negative.   Respiratory: Negative for chest tightness and shortness of breath.   Cardiovascular: Negative for chest pain.  Gastrointestinal: Negative for nausea and abdominal pain.  Genitourinary: Negative.   Musculoskeletal: Negative for joint swelling, arthralgias and neck pain.  Skin: Positive for rash. Negative for wound.  Neurological: Negative for  dizziness, weakness, light-headedness, numbness and headaches.  Psychiatric/Behavioral: Negative.       Allergies  Review of patient's allergies indicates no known allergies.  Home Medications   Prior to Admission medications   Medication Sig Start Date End Date Taking? Authorizing Provider  HYDROcodone-acetaminophen (NORCO/VICODIN) 5-325 MG per tablet Take 2 tablets by mouth every 4 (four) hours as needed for moderate pain. Patient not taking: Reported on 12/31/2013 09/13/13   Gilda Creasehristopher J. Pollina, MD  ibuprofen (ADVIL,MOTRIN) 800 MG tablet Take 1 tablet (800 mg total) by mouth 3 (three) times daily. Patient not taking: Reported on 12/31/2013 09/13/13   Gilda Creasehristopher J. Pollina, MD  metroNIDAZOLE (FLAGYL) 500 MG tablet Take 1 tablet (500 mg total) by mouth 2 (two) times daily. One po bid x 7 days Patient not taking: Reported on 12/31/2013 09/13/13   Gilda Creasehristopher J. Pollina, MD  triamcinolone cream (KENALOG) 0.1 % Apply 1 application topically 2 (two) times daily. 12/31/13   Burgess AmorJulie Fayola Meckes, PA-C   BP 112/64 mmHg  Pulse 68  Temp(Src) 98.4 F (36.9 C) (Oral)  Resp 18  Ht 5\' 5"  (1.651 m)  Wt 145 lb (65.772 kg)  BMI 24.13 kg/m2  SpO2 100%  LMP 12/25/2013 Physical Exam  Constitutional: She appears well-developed and well-nourished.  HENT:  Head: Normocephalic and atraumatic.  Eyes: Conjunctivae are normal.  Neck: Normal range of motion.  Cardiovascular: Normal rate, regular rhythm, normal heart sounds and intact distal pulses.  Pulmonary/Chest: Effort normal and breath sounds normal. She has no wheezes.  Abdominal: Soft. Bowel sounds are normal. There is no tenderness.  Musculoskeletal: Normal range of motion.  Neurological: She is alert.  Skin: Skin is warm and dry. Rash noted. There is erythema.  Insect bite-like, slightly raised red lesion to left dorsal 3rd and 5th fingers and left dorsal foot. No red streaking. No fluctuance or drainage.  Psychiatric: She has a normal mood and  affect.  Nursing note and vitals reviewed.   ED Course  Procedures  DIAGNOSTIC STUDIES: Oxygen Saturation is 100% on room air, normal by my interpretation.    COORDINATION OF CARE: 1:47 PM-Discussed treatment plan which includes cold compresses and Benadryl prn with pt at bedside and pt agreed to plan.    Labs Review Labs Reviewed - No data to display  Imaging Review No results found.   EKG Interpretation None      MDM   Final diagnoses:  Rash    Localized skin reaction, possible insect bite.  No evidence of infection, no abscess, pustules, red streaking.  Advised triamcinolone cream bid. F/u for  Recheck if not improving over 1 week.  I personally performed the services described in this documentation, which was scribed in my presence. The recorded information has been reviewed and is accurate.   Burgess AmorJulie Ormand Senn, PA-C 01/02/14 1823  Audree CamelScott T Goldston, MD 01/04/14 [id number]

## 2013-12-31 NOTE — ED Notes (Signed)
Rash to left hand and left foot.  Rash started three days ago to left hand (middle finger) and started to left foot two days ago.  Took benadryl with no relief.

## 2014-01-09 ENCOUNTER — Encounter (HOSPITAL_COMMUNITY): Payer: Self-pay | Admitting: *Deleted

## 2014-01-09 ENCOUNTER — Emergency Department (HOSPITAL_COMMUNITY)
Admission: EM | Admit: 2014-01-09 | Discharge: 2014-01-09 | Disposition: A | Discharge status: Home / Self Care | Payer: Medicaid Other | Attending: Emergency Medicine | Admitting: Emergency Medicine

## 2014-01-09 DIAGNOSIS — M546 Pain in thoracic spine: Secondary | ICD-10-CM | POA: Insufficient documentation

## 2014-01-09 DIAGNOSIS — M62830 Muscle spasm of back: Secondary | ICD-10-CM | POA: Diagnosis not present

## 2014-01-09 DIAGNOSIS — Z862 Personal history of diseases of the blood and blood-forming organs and certain disorders involving the immune mechanism: Secondary | ICD-10-CM | POA: Insufficient documentation

## 2014-01-09 DIAGNOSIS — Z720 Tobacco use: Secondary | ICD-10-CM | POA: Insufficient documentation

## 2014-01-09 DIAGNOSIS — M545 Low back pain: Secondary | ICD-10-CM | POA: Diagnosis present

## 2014-01-09 MED ORDER — IBUPROFEN 800 MG PO TABS: 800.0000 mg | ORAL_TABLET | Freq: Three times a day (TID) | ORAL | Status: DC | PRN

## 2014-01-09 MED ORDER — CYCLOBENZAPRINE HCL 5 MG PO TABS: 5.0000 mg | ORAL_TABLET | Freq: Three times a day (TID) | ORAL | Status: DC | PRN

## 2014-01-09 NOTE — ED Notes (Signed)
Pt co low back pain, started last night, denies injury.

## 2014-01-09 NOTE — Care Management Note (Signed)
ED/CM noted patient did not have health insurance and/or PCP listed in the computer.  Patient was given the [place] resource handout with information on the clinics, food pantries, and the handout for new health insurance sign-up.  Patient expressed appreciation for information received. 

## 2014-01-09 NOTE — Discharge Instructions (Signed)
Back Pain, Adult °Low back pain is very common. About 1 in 5 people have back pain. The cause of low back pain is rarely dangerous. The pain often gets better over time. About half of people with a sudden onset of back pain feel better in just 2 weeks. About 8 in 10 people feel better by 6 weeks.  °CAUSES °Some common causes of back pain include: °· Strain of the muscles or ligaments supporting the spine. °· Wear and tear (degeneration) of the spinal discs. °· Arthritis. °· Direct injury to the back. °DIAGNOSIS °Most of the time, the direct cause of low back pain is not known. However, back pain can be treated effectively even when the exact cause of the pain is unknown. Answering your caregiver's questions about your overall health and symptoms is one of the most accurate ways to make sure the cause of your pain is not dangerous. If your caregiver needs more information, he or she may order lab work or imaging tests (X-rays or MRIs). However, even if imaging tests show changes in your back, this usually does not require surgery. °HOME CARE INSTRUCTIONS °For many people, back pain returns. Since low back pain is rarely dangerous, it is often a condition that people can learn to manage on their own.  °· Remain active. It is stressful on the back to sit or stand in one place. Do not sit, drive, or stand in one place for more than 30 minutes at a time. Take short walks on level surfaces as soon as pain allows. Try to increase the length of time you walk each day. °· Do not stay in bed. Resting more than 1 or 2 days can delay your recovery. °· Do not avoid exercise or work. Your body is made to move. It is not dangerous to be active, even though your back may hurt. Your back will likely heal faster if you return to being active before your pain is gone. °· Pay attention to your body when you  bend and lift. Many people have less discomfort when lifting if they bend their knees, keep the load close to their bodies, and  avoid twisting. Often, the most comfortable positions are those that put less stress on your recovering back. °· Find a comfortable position to sleep. Use a firm mattress and lie on your side with your knees slightly bent. If you lie on your back, put a pillow under your knees. °· Only take over-the-counter or prescription medicines as directed by your caregiver. Over-the-counter medicines to reduce pain and inflammation are often the most helpful. Your caregiver may prescribe muscle relaxant drugs. These medicines help dull your pain so you can more quickly return to your normal activities and healthy exercise. °· Put ice on the injured area. °¨ Put ice in a plastic bag. °¨ Place a towel between your skin and the bag. °¨ Leave the ice on for 15-20 minutes, 03-04 times a day for the first 2 to 3 days. After that, ice and heat may be alternated to reduce pain and spasms. °· Ask your caregiver about trying back exercises and gentle massage. This may be of some benefit. °· Avoid feeling anxious or stressed. Stress increases muscle tension and can worsen back pain. It is important to recognize when you are anxious or stressed and learn ways to manage it. Exercise is a great option. °SEEK MEDICAL CARE IF: °· You have pain that is not relieved with rest or medicine. °· You have pain that does not improve in 1 week. °· You have new symptoms. °· You are generally not feeling well. °SEEK   IMMEDIATE MEDICAL CARE IF:  °· You have pain that radiates from your back into your legs. °· You develop new bowel or bladder control problems. °· You have unusual weakness or numbness in your arms or legs. °· You develop nausea or vomiting. °· You develop abdominal pain. °· You feel faint. °Document Released: 02/08/2005 Document Revised: 08/10/2011 Document Reviewed: 06/12/2013 °ExitCare® Patient Information ©2015 [employer]. This information is not intended to replace advice given to you by your health care provider. Make sure you  discuss any questions you have with your health care provider. ° °Thoracic Strain °You have injured the muscles or tendons that attach to the upper part of your back behind your chest. This injury is called a thoracic strain, thoracic sprain, or mid-back strain.  °CAUSES  °The cause of thoracic strain varies. A less severe injury involves pulling a muscle or tendon without tearing it. A more severe injury involves tearing (rupturing) a muscle or tendon. With less severe injuries, there may be little loss of strength. Sometimes, there are breaks (fractures) in the bones to which the muscles are attached. These fractures are rare, unless there was a direct hit (trauma) or you have weak bones due to osteoporosis or age. Longstanding strains may be caused by overuse or improper form during certain movements. Obesity can also increase your risk for back injuries. Sudden strains may occur due to injury or not warming up properly before exercise. Often, there is no obvious cause for a thoracic strain. °SYMPTOMS  °The main symptom is pain, especially with movement, such as during exercise. °DIAGNOSIS  °Your caregiver can usually tell what is wrong by taking an X-ray and doing a physical exam. °TREATMENT  °· Physical therapy may be helpful for recovery. Your caregiver can give you exercises to do or refer you to a physical therapist after your pain improves. °· After your pain improves, strengthening and conditioning programs appropriate for your sport or occupation may be helpful. °· Always warm up before physical activities or athletics. Stretching after physical activity may also help. °· Certain over-the-counter medicines may also help. Ask your caregiver if there are medicines that would help you. °If this is your first thoracic strain injury, proper care and proper healing time before starting activities should prevent long-term problems. Torn ligaments and tendons require as long to heal as broken bones. Average  healing times may be only 1 week for a mild strain. For torn muscles and tendons, healing time may be up to 6 weeks to 2 months. °HOME CARE INSTRUCTIONS  °· Apply ice to the injured area. Ice massages may also be used as directed. °¨ Put ice in a plastic bag. °¨ Place a towel between your skin and the bag. °¨ Leave the ice on for 15-20 minutes, 03-04 times a day, for the first 2 days. °· Only take over-the-counter or prescription medicines for pain, discomfort, or fever as directed by your caregiver. °· Keep your appointments for physical therapy if this was prescribed. °· Use wraps and back braces as instructed. °SEEK IMMEDIATE MEDICAL CARE IF:  °· You have an increase in bruising, swelling, or pain. °· Your pain has not improved with medicines. °· You develop new shortness of breath, chest pain, or fever. °· Problems seem to be getting worse rather than better. °MAKE SURE YOU:  °· Understand these instructions. °· Will watch your condition. °· Will get help right away if you are not doing well or get worse. °Document Released: 05/01/2003 Document Revised:   05/03/2011 Document Reviewed: 03/27/2010 °ExitCare® Patient Information ©2015 [employer]. This information is not intended to replace advice given to you by your health care provider. Make sure you discuss any questions you have with your health care provider. ° °

## 2014-01-09 NOTE — ED Provider Notes (Signed)
TIME SEEN: 2:05 PM  CHIEF COMPLAINT: lower back pain  HPI: Patient is a 23 y.o. F with no sig PMH who complains of lower back pain, beginning last night upon settling down in bed. Her pain is worse with deep breathing, bending over, movement. She denies any injury or trauma. She denies any bowel or bladder incontinence, urinary symptoms, fever, cough. She has not taken any medications to manage her symptoms. denies history of prior back pain.  ROS: See HPI Constitutional: no fever  Eyes: no drainage  ENT: no runny nose   Cardiovascular:  no chest pain  Resp: no SOB  GI: no vomiting GU: no dysuria Integumentary: no rash  Allergy: no hives  Musculoskeletal: no leg swelling  Neurological: no slurred speech ROS otherwise negative  PAST MEDICAL HISTORY/PAST SURGICAL HISTORY:  Past Medical History  Diagnosis Date  . Anemia     MEDICATIONS:  Prior to Admission medications   Medication Sig Start Date End Date Taking? Authorizing Provider  HYDROcodone-acetaminophen (NORCO/VICODIN) 5-325 MG per tablet Take 2 tablets by mouth every 4 (four) hours as needed for moderate pain. Patient not taking: Reported on 12/31/2013 09/13/13   Gilda Creasehristopher J. Pollina, MD  ibuprofen (ADVIL,MOTRIN) 800 MG tablet Take 1 tablet (800 mg total) by mouth 3 (three) times daily. Patient not taking: Reported on 12/31/2013 09/13/13   Gilda Creasehristopher J. Pollina, MD  metroNIDAZOLE (FLAGYL) 500 MG tablet Take 1 tablet (500 mg total) by mouth 2 (two) times daily. One po bid x 7 days Patient not taking: Reported on 12/31/2013 09/13/13   Gilda Creasehristopher J. Pollina, MD  triamcinolone cream (KENALOG) 0.1 % Apply 1 application topically 2 (two) times daily. Patient not taking: Reported on 01/09/2014 12/31/13   Burgess AmorJulie Idol, PA-C    ALLERGIES:  No Known Allergies  SOCIAL HISTORY:  History  Substance Use Topics  . Smoking status: Current Some Day Smoker -- 0.25 packs/day    Types: Cigarettes  . Smokeless tobacco: Not on file  .  Alcohol Use: No     Comment: socially    FAMILY HISTORY: History reviewed. No pertinent family history.  EXAM: BP 118/82 mmHg  Pulse 91  Temp(Src) 98.7 F (37.1 C) (Oral)  Resp 18  Ht 5\' 5"  (1.651 m)  Wt 144 lb (65.318 kg)  BMI 23.96 kg/m2  SpO2 100%  LMP 12/25/2013 CONSTITUTIONAL: Alert and oriented and responds appropriately to questions. Well-appearing; well-nourished HEAD: Normocephalic EYES: Conjunctivae clear, PERRL ENT: normal nose; no rhinorrhea; moist mucous membranes; pharynx without lesions noted NECK: Supple, no meningismus, no LAD  CARD: RRR; S1 and S2 appreciated; no murmurs, no clicks, no rubs, no gallops RESP: Normal chest excursion without splinting or tachypnea; breath sounds clear and equal bilaterally; no wheezes, no rhonchi, no rales,  ABD/GI: Normal bowel sounds; non-distended; soft, non-tender, no rebound, no guarding BACK:  The back appears normal; there is no CVA tenderness; no midline spinal tenderness, slight right-sided latismus dorsi tenderness with muscle spasm  EXT: Normal ROM in all joints; non-tender to palpation; no edema; normal capillary refill; no cyanosis    SKIN: Normal color for age and race; warm NEURO: Moves all extremities equally, sensation normal and equal in all extremities; normal gait PSYCH: The patient's mood and manner are appropriate. Grooming and personal hygiene are appropriate.  MEDICAL DECISION MAKING: Pt here with right-sided thoracic back pain. Tender to palpation of her latissimus dorsi with associated spasm. We'll discharge with prescriptions for ibuprofen and Flexeril. Have discussed stretching, using heat to this area. No  history of injury. No midline spinal tenderness. No neurologic deficit. No shortness of breath or cough. Lungs are clear. Discussed return precautions. Patient verbalizes understanding and is comfortable with plan.       Layla MawKristen N Rayshawn Maney, DO 01/09/14 1432

## 2014-04-11 ENCOUNTER — Encounter (HOSPITAL_COMMUNITY): Payer: Self-pay

## 2014-04-11 ENCOUNTER — Emergency Department (HOSPITAL_COMMUNITY)
Admission: EM | Admit: 2014-04-11 | Discharge: 2014-04-11 | Disposition: A | Discharge status: Home / Self Care | Payer: Medicaid Other | Attending: Emergency Medicine | Admitting: Emergency Medicine

## 2014-04-11 DIAGNOSIS — K029 Dental caries, unspecified: Secondary | ICD-10-CM | POA: Diagnosis not present

## 2014-04-11 DIAGNOSIS — K047 Periapical abscess without sinus: Secondary | ICD-10-CM | POA: Insufficient documentation

## 2014-04-11 DIAGNOSIS — Z720 Tobacco use: Secondary | ICD-10-CM | POA: Insufficient documentation

## 2014-04-11 DIAGNOSIS — K0381 Cracked tooth: Secondary | ICD-10-CM | POA: Insufficient documentation

## 2014-04-11 DIAGNOSIS — Z862 Personal history of diseases of the blood and blood-forming organs and certain disorders involving the immune mechanism: Secondary | ICD-10-CM | POA: Insufficient documentation

## 2014-04-11 DIAGNOSIS — K088 Other specified disorders of teeth and supporting structures: Secondary | ICD-10-CM | POA: Diagnosis present

## 2014-04-11 MED ORDER — AMOXICILLIN 500 MG PO CAPS: 500.0000 mg | ORAL_CAPSULE | Freq: Three times a day (TID) | ORAL | Status: AC

## 2014-04-11 MED ORDER — TRAMADOL HCL 50 MG PO TABS: 50.0000 mg | ORAL_TABLET | Freq: Four times a day (QID) | ORAL | Status: DC | PRN

## 2014-04-11 NOTE — ED Provider Notes (Signed)
CSN: [id number]     Arrival date & time 04/11/14  1155 History   First MD Initiated Contact with Patient 04/11/14 1343     Chief Complaint  Patient presents with  . Dental Pain     (Consider location/radiation/quality/duration/timing/severity/associated sxs/prior Treatment) The history is provided by the patient.   Cassandra Berg is a 24 y.o. female presenting with a one week history of worsened dental pain since another portion of tooth fractured from an already fractured and decayed tooth.  Her dentist is at [facility]  She had made an appointment for this same problem, but cancelled when the pain improved last month.  There has been no fevers, chills, nausea or vomiting, also no complaint of difficulty swallowing, although chewing makes pain worse.  The patient has tried ibuprofen without relief of symptoms.         Past Medical History  Diagnosis Date  . Anemia    History reviewed. No pertinent past surgical history. No family history on file. History  Substance Use Topics  . Smoking status: Current Some Day Smoker -- 0.25 packs/day    Types: Cigarettes  . Smokeless tobacco: Not on file  . Alcohol Use: No     Comment: socially   OB History    No data available     Review of Systems  Constitutional: Negative for fever.  HENT: Positive for dental problem. Negative for facial swelling and sore throat.   Respiratory: Negative for shortness of breath.   Musculoskeletal: Negative for neck pain and neck stiffness.      Allergies  Review of patient's allergies indicates no known allergies.  Home Medications   Prior to Admission medications   Medication Sig Start Date End Date Taking? Authorizing Provider  amoxicillin (AMOXIL) 500 MG capsule Take 1 capsule (500 mg total) by mouth 3 (three) times daily. 04/11/14 04/21/14  Burgess Amor, PA-C  cyclobenzaprine (FLEXERIL) 5 MG tablet Take 1 tablet (5 mg total) by mouth 3 (three) times daily as needed for muscle  spasms. 01/09/14   Kristen N Ward, DO  HYDROcodone-acetaminophen (NORCO/VICODIN) 5-325 MG per tablet Take 2 tablets by mouth every 4 (four) hours as needed for moderate pain. Patient not taking: Reported on 12/31/2013 09/13/13   Gilda Crease, MD  ibuprofen (ADVIL,MOTRIN) 800 MG tablet Take 1 tablet (800 mg total) by mouth every 8 (eight) hours as needed for mild pain. 01/09/14   Kristen N Ward, DO  metroNIDAZOLE (FLAGYL) 500 MG tablet Take 1 tablet (500 mg total) by mouth 2 (two) times daily. One po bid x 7 days Patient not taking: Reported on 12/31/2013 09/13/13   Gilda Crease, MD  traMADol (ULTRAM) 50 MG tablet Take 1 tablet (50 mg total) by mouth every 6 (six) hours as needed. 04/11/14   Burgess Amor, PA-C  triamcinolone cream (KENALOG) 0.1 % Apply 1 application topically 2 (two) times daily. Patient not taking: Reported on 01/09/2014 12/31/13   Burgess Amor, PA-C   BP 113/76 mmHg  Pulse 89  Temp(Src) 98.5 F (36.9 C) (Oral)  Resp 20  Ht  (1.676 m)  Wt 125 lb (56.7 kg)  BMI 20.19 kg/m2  SpO2 99%  LMP 03/25/2014 Physical Exam  Constitutional: She is oriented to person, place, and time. She appears well-developed and well-nourished. No distress.  HENT:  Head: Normocephalic and atraumatic.  Right Ear: Tympanic membrane and external ear normal.  Left Ear: Tympanic membrane and external ear normal.  Mouth/Throat: Oropharynx is clear and  moist and mucous membranes are normal. No oral lesions. No trismus in the jaw. No dental abscesses.    Eyes: Conjunctivae are normal.  Neck: Normal range of motion. Neck supple.  Cardiovascular: Normal rate and normal heart sounds.   Pulmonary/Chest: Effort normal.  Abdominal: She exhibits no distension.  Musculoskeletal: Normal range of motion.  Lymphadenopathy:    She has no cervical adenopathy.  Neurological: She is alert and oriented to person, place, and time.  Skin: Skin is warm and dry. No erythema.  Psychiatric: She has a  normal mood and affect.    ED Course  Procedures (including critical care time) Labs Review Labs Reviewed - No data to display  Imaging Review No results found.   EKG Interpretation None      MDM   Final diagnoses:  Dental infection    Chronic dental decay/fracture with suspicion for early infection/abscess.  Placed on amoxil, tramadol.  Advised she needs definitive treatment by her dentist to avoid future episodes of pain and infection.  Patient will call for an appointment with her dentist.  The patient appears reasonably screened and/or stabilized for discharge and I doubt any other medical condition or other [facility] requiring further screening, evaluation, or treatment in the ED at this time prior to discharge.     Burgess AmorJulie Teague Goynes, PA-C 04/11/14 1548  Audree CamelScott T Goldston, MD 04/18/14 [phone]

## 2014-04-11 NOTE — ED Notes (Signed)
nad noted prior to dc. Dc instructions reviewed and rx given.  

## 2014-04-11 NOTE — ED Notes (Signed)
Pt c/o toothache x 1 week.

## 2014-04-11 NOTE — Discharge Instructions (Signed)
Dental Abscess A dental abscess is a collection of infected fluid (pus) from a bacterial infection in the inner part of the tooth (pulp). It usually occurs at the end of the tooth's root.  CAUSES   Severe tooth decay.  Trauma to the tooth that allows bacteria to enter into the pulp, such as a broken or chipped tooth. SYMPTOMS   Severe pain in and around the infected tooth.  Swelling and redness around the abscessed tooth or in the mouth or face.  Tenderness.  Pus drainage.  Bad breath.  Bitter taste in the mouth.  Difficulty swallowing.  Difficulty opening the mouth.  Nausea.  Vomiting.  Chills.  Swollen neck glands. DIAGNOSIS   A medical and dental history will be taken.  An examination will be performed by tapping on the abscessed tooth.  X-rays may be taken of the tooth to identify the abscess. TREATMENT The goal of treatment is to eliminate the infection. You may be prescribed antibiotic medicine to stop the infection from spreading. A root canal may be performed to save the tooth. If the tooth cannot be saved, it may be pulled (extracted) and the abscess may be drained.  HOME CARE INSTRUCTIONS  Only take over-the-counter or prescription medicines for pain, fever, or discomfort as directed by your caregiver.  Rinse your mouth (gargle) often with salt water ( tsp salt in 8 oz [250 ml] of warm water) to relieve pain or swelling.  Do not drive after taking pain medicine (narcotics).  Do not apply heat to the outside of your face.  Return to your dentist for further treatment as directed. SEEK MEDICAL CARE IF:  Your pain is not helped by medicine.  Your pain is getting worse instead of better. SEEK IMMEDIATE MEDICAL CARE IF:  You have a fever or persistent symptoms for more than 2-3 days.  You have a fever and your symptoms suddenly get worse.  You have chills or a very bad headache.  You have problems breathing or swallowing.  You have trouble  opening your mouth.  You have swelling in the neck or around the eye. Document Released: 02/08/2005 Document Revised: 11/03/2011 Document Reviewed: 05/19/2010 [facility]Exit[facility] Patient Information 2015 [place], [place]. This information is not intended to replace advice given to you by your health care provider. Make sure you discuss any questions you have with your health care provider.  As discussed I believe you may have a early dental infection and are being treated with antibiotics for this.  You may also try Anbesol or Orajel which may help with topical pain relief.  Also look for dental putty which is a temporary treatment for nerve pain.  Follow up with your dentist as planned.

## 2014-09-21 ENCOUNTER — Encounter (HOSPITAL_COMMUNITY): Payer: Self-pay | Admitting: *Deleted

## 2014-09-21 ENCOUNTER — Emergency Department (HOSPITAL_COMMUNITY)
Admission: EM | Admit: 2014-09-21 | Discharge: 2014-09-21 | Disposition: A | Discharge status: Home / Self Care | Payer: Medicaid Other | Attending: Emergency Medicine | Admitting: Emergency Medicine

## 2014-09-21 DIAGNOSIS — Z862 Personal history of diseases of the blood and blood-forming organs and certain disorders involving the immune mechanism: Secondary | ICD-10-CM | POA: Diagnosis not present

## 2014-09-21 DIAGNOSIS — K088 Other specified disorders of teeth and supporting structures: Secondary | ICD-10-CM | POA: Diagnosis present

## 2014-09-21 DIAGNOSIS — K0889 Other specified disorders of teeth and supporting structures: Secondary | ICD-10-CM

## 2014-09-21 DIAGNOSIS — Z720 Tobacco use: Secondary | ICD-10-CM | POA: Diagnosis not present

## 2014-09-21 MED ORDER — AMOXICILLIN 250 MG PO CAPS
500.0000 mg | ORAL_CAPSULE | Freq: Once | ORAL | Status: AC
  Administered 2014-09-21: 500 mg via ORAL
  Filled 2014-09-21: qty 2

## 2014-09-21 MED ORDER — KETOROLAC TROMETHAMINE 10 MG PO TABS
10.0000 mg | ORAL_TABLET | Freq: Once | ORAL | Status: AC
Start: 2014-09-21 — End: 2014-09-21
  Administered 2014-09-21: 10 mg via ORAL
  Filled 2014-09-21: qty 1

## 2014-09-21 MED ORDER — AMOXICILLIN 500 MG PO CAPS: 500.0000 mg | ORAL_CAPSULE | Freq: Three times a day (TID) | ORAL | Status: DC

## 2014-09-21 MED ORDER — IBUPROFEN 800 MG PO TABS: 800.0000 mg | ORAL_TABLET | Freq: Three times a day (TID) | ORAL | Status: DC

## 2014-09-21 MED ORDER — ACETAMINOPHEN 325 MG PO TABS
650.0000 mg | ORAL_TABLET | Freq: Once | ORAL | Status: AC
Start: 2014-09-21 — End: 2014-09-21
  Administered 2014-09-21: 650 mg via ORAL
  Filled 2014-09-21: qty 2

## 2014-09-21 NOTE — Discharge Instructions (Signed)
Please take Amoxil, ibuprofen 800 mg, and 500 mg of Tylenol 3 times daily. Please see your dentist systems possible concerning the cavities present. Dental Pain A tooth ache may be caused by cavities (tooth decay). Cavities expose the nerve of the tooth to air and hot or cold temperatures. It may come from an infection or abscess (also called a boil or furuncle) around your tooth. It is also often caused by dental caries (tooth decay). This causes the pain you are having. DIAGNOSIS  Your caregiver can diagnose this problem by exam. TREATMENT   If caused by an infection, it may be treated with medications which kill germs (antibiotics) and pain medications as prescribed by your caregiver. Take medications as directed.  Only take over-the-counter or prescription medicines for pain, discomfort, or fever as directed by your caregiver.  Whether the tooth ache today is caused by infection or dental disease, you should see your dentist as soon as possible for further care. SEEK MEDICAL CARE IF: The exam and treatment you received today has been provided on an emergency basis only. This is not a substitute for complete medical or dental care. If your problem worsens or new problems (symptoms) appear, and you are unable to meet with your dentist, call or return to this location. SEEK IMMEDIATE MEDICAL CARE IF:   You have a fever.  You develop redness and swelling of your face, jaw, or neck.  You are unable to open your mouth.  You have severe pain uncontrolled by pain medicine. MAKE SURE YOU:   Understand these instructions.  Will watch your condition.  Will get help right away if you are not doing well or get worse. Document Released: 02/08/2005 Document Revised: 05/03/2011 Document Reviewed: 09/27/2007 [facility] Patient Information 2015 [place], [place]. This information is not intended to replace advice given to you by your health care provider. Make sure you discuss any questions you have  with your health care provider.

## 2014-09-21 NOTE — ED Provider Notes (Signed)
CSN: [id number]     Arrival date & time 09/21/14  1340 History   First MD Initiated Contact with Patient 09/21/14 1431     Chief Complaint  Patient presents with  . Dental Pain     (Consider location/radiation/quality/duration/timing/severity/associated sxs/prior Treatment) Patient is a 24 y.o. female presenting with tooth pain. The history is provided by the patient.  Dental Pain Location:  Upper Quality:  Aching Severity:  Moderate Onset quality:  Gradual Duration:  2 weeks Timing:  Intermittent Progression:  Worsening Chronicity:  Chronic Context: dental caries and poor dentition   Relieved by:  Nothing Ineffective treatments:  Acetaminophen and NSAIDs Associated symptoms: gum swelling and headaches   Associated symptoms: no difficulty swallowing, no drooling and no trismus   Risk factors: smoking   Risk factors: no diabetes and no immunosuppression     Past Medical History  Diagnosis Date  . Anemia    History reviewed. No pertinent past surgical history. History reviewed. No pertinent family history. History  Substance Use Topics  . Smoking status: Current Some Day Smoker -- 0.25 packs/day    Types: Cigarettes  . Smokeless tobacco: Not on file  . Alcohol Use: No     Comment: socially   OB History    No data available     Review of Systems  HENT: Positive for dental problem. Negative for drooling.   Neurological: Positive for headaches.  All other systems reviewed and are negative.     Allergies  Review of patient's allergies indicates no known allergies.  Home Medications   Prior to Admission medications   Medication Sig Start Date End Date Taking? Authorizing Provider  acetaminophen (TYLENOL) 500 MG tablet Take 1,500 mg by mouth every 6 (six) hours as needed.   Yes Historical Provider, MD  ibuprofen (ADVIL,MOTRIN) 200 MG tablet Take 800 mg by mouth every 6 (six) hours as needed for moderate pain.   Yes Historical Provider, MD   BP 115/77 mmHg   Pulse 69  Temp(Src) 98.1 F (36.7 C) (Oral)  Resp 16  Ht  (1.676 m)  Wt 135 lb (61.236 kg)  BMI 21.80 kg/m2  SpO2 100%  LMP 09/09/2014 Physical Exam  Constitutional: She is oriented to person, place, and time. She appears well-developed and well-nourished.  Non-toxic appearance.  HENT:  Head: Normocephalic.  Right Ear: Tympanic membrane and external ear normal.  Left Ear: Tympanic membrane and external ear normal.  There is a cavity of the upper right posterior molar. There is mild swelling of the gum present. The airway is patent. There is no swelling under the tongue.  No significant facial swelling/asymmetry appreciated.  Eyes: EOM and lids are normal. Pupils are equal, round, and reactive to light.  Neck: Normal range of motion. Neck supple. Carotid bruit is not present.  Cardiovascular: Normal rate, regular rhythm, normal heart sounds, intact distal pulses and normal pulses.   Pulmonary/Chest: Breath sounds normal. No respiratory distress.  Abdominal: Soft. Bowel sounds are normal. There is no tenderness. There is no guarding.  Musculoskeletal: Normal range of motion.  Lymphadenopathy:       Head (right side): No submandibular adenopathy present.       Head (left side): No submandibular adenopathy present.    She has no cervical adenopathy.  Neurological: She is alert and oriented to person, place, and time. She has normal strength. No cranial nerve deficit or sensory deficit.  Skin: Skin is warm and dry.  Psychiatric: She has a normal mood and  affect. Her speech is normal.  Nursing note and vitals reviewed.   ED Course  Procedures (including critical care time) Labs Review Labs Reviewed - No data to display  Imaging Review No results found.   EKG Interpretation None      MDM  Patient states she has had a problem with this tooth for quite some time, but has been putting off going to the dentist. In the last few weeks is been getting gradually worse. There is  no evidence of visible abscess. The airway is patent. There is no evidence for Ludwig's angina. prescription for Amoxil and Motrin given to the patient. Patient also asked to use 500 mg of Tylenol 3 times daily along with the Amoxil and ibuprofen. The patient states she is going to make contact with a dentist next week.    Final diagnoses:  None    **I have reviewed nursing notes, vital signs, and all appropriate lab and imaging results for this patient.Ivery Quale, PA-C 09/21/14 1525  Mancel Bale, MD 09/21/14 [phone]

## 2014-09-21 NOTE — ED Notes (Signed)
Pt with right upper toothache ongoing for a few weeks per pt, states broken tooth years ago

## 2014-11-19 ENCOUNTER — Encounter (HOSPITAL_COMMUNITY): Payer: Self-pay | Admitting: Emergency Medicine

## 2014-11-19 ENCOUNTER — Emergency Department (HOSPITAL_COMMUNITY)
Admission: EM | Admit: 2014-11-19 | Discharge: 2014-11-19 | Disposition: A | Discharge status: Home / Self Care | Payer: Medicaid Other | Attending: Emergency Medicine | Admitting: Emergency Medicine

## 2014-11-19 DIAGNOSIS — S76219A Strain of adductor muscle, fascia and tendon of unspecified thigh, initial encounter: Secondary | ICD-10-CM

## 2014-11-19 DIAGNOSIS — Z792 Long term (current) use of antibiotics: Secondary | ICD-10-CM | POA: Insufficient documentation

## 2014-11-19 DIAGNOSIS — S3991XA Unspecified injury of abdomen, initial encounter: Secondary | ICD-10-CM | POA: Diagnosis present

## 2014-11-19 DIAGNOSIS — S76911A Strain of unspecified muscles, fascia and tendons at thigh level, right thigh, initial encounter: Secondary | ICD-10-CM | POA: Diagnosis not present

## 2014-11-19 DIAGNOSIS — Z791 Long term (current) use of non-steroidal anti-inflammatories (NSAID): Secondary | ICD-10-CM | POA: Diagnosis not present

## 2014-11-19 DIAGNOSIS — Y9289 Other specified places as the place of occurrence of the external cause: Secondary | ICD-10-CM | POA: Insufficient documentation

## 2014-11-19 DIAGNOSIS — Y9389 Activity, other specified: Secondary | ICD-10-CM | POA: Insufficient documentation

## 2014-11-19 DIAGNOSIS — Z720 Tobacco use: Secondary | ICD-10-CM | POA: Insufficient documentation

## 2014-11-19 DIAGNOSIS — Z862 Personal history of diseases of the blood and blood-forming organs and certain disorders involving the immune mechanism: Secondary | ICD-10-CM | POA: Diagnosis not present

## 2014-11-19 DIAGNOSIS — X58XXXA Exposure to other specified factors, initial encounter: Secondary | ICD-10-CM | POA: Diagnosis not present

## 2014-11-19 DIAGNOSIS — Y998 Other external cause status: Secondary | ICD-10-CM | POA: Insufficient documentation

## 2014-11-19 LAB — URINALYSIS, ROUTINE W REFLEX MICROSCOPIC
Bilirubin Urine: NEGATIVE
Glucose, UA: NEGATIVE mg/dL
Hgb urine dipstick: NEGATIVE
Ketones, ur: NEGATIVE mg/dL
Leukocytes, UA: NEGATIVE
NITRITE: NEGATIVE
PH: 7 (ref 5.0–8.0)
Protein, ur: NEGATIVE mg/dL
Specific Gravity, Urine: 1.015 (ref 1.005–1.030)
UROBILINOGEN UA: 0.2 mg/dL (ref 0.0–1.0)

## 2014-11-19 LAB — PREGNANCY, URINE: PREG TEST UR: NEGATIVE

## 2014-11-19 MED ORDER — METHYLPREDNISOLONE SODIUM SUCC 125 MG IJ SOLR: 125.0000 mg | Freq: Once | INTRAMUSCULAR | Status: DC

## 2014-11-19 MED ORDER — DICLOFENAC SODIUM 75 MG PO TBEC: 75.0000 mg | DELAYED_RELEASE_TABLET | Freq: Two times a day (BID) | ORAL | Status: DC

## 2014-11-19 MED ORDER — METHOCARBAMOL 500 MG PO TABS: 500.0000 mg | ORAL_TABLET | Freq: Two times a day (BID) | ORAL | Status: DC

## 2014-11-19 NOTE — ED Provider Notes (Signed)
CSN: [id number]     Arrival date & time 11/19/14  1143 History   First MD Initiated Contact with Patient 11/19/14 1208     Chief Complaint  Patient presents with  . Groin Pain     (Consider location/radiation/quality/duration/timing/severity/associated sxs/prior Treatment) Patient is a 24 y.o. female presenting with groin pain. The history is provided by the patient. No language interpreter was used.  Groin Pain This is a new problem. The current episode started in the past 7 days. The problem occurs intermittently. The problem has been gradually worsening. Nothing aggravates the symptoms. She has tried nothing for the symptoms. The treatment provided no relief.    Past Medical History  Diagnosis Date  . Anemia    History reviewed. No pertinent past surgical history. History reviewed. No pertinent family history. Social History  Substance Use Topics  . Smoking status: Current Some Day Smoker -- 0.25 packs/day    Types: Cigarettes  . Smokeless tobacco: None  . Alcohol Use: No     Comment: socially   OB History    No data available     Review of Systems  All other systems reviewed and are negative.     Allergies  Review of patient's allergies indicates no known allergies.  Home Medications   Prior to Admission medications   Medication Sig Start Date End Date Taking? Authorizing Provider  acetaminophen (TYLENOL) 500 MG tablet Take 1,500 mg by mouth every 6 (six) hours as needed.    Historical Provider, MD  amoxicillin (AMOXIL) 500 MG capsule Take 1 capsule (500 mg total) by mouth 3 (three) times daily. 09/21/14   Ivery Quale, PA-C  ibuprofen (ADVIL,MOTRIN) 800 MG tablet Take 1 tablet (800 mg total) by mouth 3 (three) times daily. 09/21/14   Ivery Quale, PA-C   BP 117/57 mmHg  Pulse 69  Temp(Src) 98.2 F (36.8 C) (Oral)  Resp 16  Ht  (1.676 m)  Wt 140 lb (63.504 kg)  BMI 22.61 kg/m2  SpO2 100%  LMP 11/07/2014 Physical Exam  Constitutional: She appears  well-developed and well-nourished.  HENT:  Head: Normocephalic and atraumatic.  Eyes: Pupils are equal, round, and reactive to light.  Cardiovascular: Normal rate.   Pulmonary/Chest: Effort normal.  Abdominal: Soft. She exhibits no distension. There is no tenderness. There is no rebound and no guarding.  Musculoskeletal:  Pain with movement of right leg  Neurological: She is alert.  Skin: Skin is warm.  Psychiatric: She has a normal mood and affect.  Nursing note and vitals reviewed.   ED Course  Procedures (including critical care time) Labs Review Labs Reviewed - No data to display  Imaging Review No results found. I have personally reviewed and evaluated these images and lab results as part of my medical decision-making.   EKG Interpretation None      MDM no evidence of acute abdomen.  Abdomen nontender,  Pt denies std possibility, no vaginal discharge.     Final diagnoses:  Groin strain, initial encounter    Robaxin\ voltaren Stretches avs    Elson Areas, PA-C 11/19/14 1357  Lonia Skinner [place], PA-C 11/19/14 1357  Blane Ohara, MD 11/20/14 [phone]

## 2014-11-19 NOTE — Discharge Instructions (Signed)
Groin Strain °A groin strain (also called a groin pull) is an injury to the muscles or tendon on the upper inner part of the thigh. These muscles are called the adductor muscles or groin muscles. They are responsible for moving the leg across the body. A muscle strain occurs when a muscle is overstretched and some muscle fibers are torn. A groin strain can range from mild to severe depending on how many muscle fibers are affected and whether the muscle fibers are partially or completely torn.  °Groin strains usually occur during exercise or participation in sports. The injury often happens when a sudden, violent force is placed on a muscle, stretching the muscle too far. A strain is more likely to occur when your muscles are not warmed up or if you are not properly conditioned. Depending on the severity of the groin strain, recovery time may vary from a few weeks to several weeks. Severe injuries often require 4-6 weeks for recovery. In these cases, complete healing can take 4-5 months.  °CAUSES  °· Stretching the groin muscles too far or too suddenly, often during side-to-side motion with an abrupt change in direction. °· Putting repeated stress on the groin muscles over a long period of time. °· Performing vigorous activity without properly stretching the groin muscles beforehand. °SYMPTOMS  °· Pain and tenderness in the groin area. This begins as sharp pain and persists as a dull ache. °· Popping or snapping feeling when the injury occurs (for severe strains). °· Swelling or bruising. °· Muscle spasms. °· Weakness in the leg. °· Stiffness in the groin area with decreased ability to move the affected muscles. °DIAGNOSIS  °Your caregiver will perform a physical exam to diagnose a groin strain. You will be asked about your symptoms and how the injury occurred. X-rays are sometimes needed to rule out a broken bone or cartilage problems. Your caregiver may order a CT scan or MRI if a complete muscle tear is  suspected. °TREATMENT  °A groin strain will often heal on its own. Your caregiver may prescribe medicines to help manage pain and swelling (anti-inflammatory medicine). You may be told to use crutches for the first few days to minimize your pain. °HOME CARE INSTRUCTIONS  °· Rest. Do not use the strained muscle if it causes pain. °· Put ice on the injured area. °¨ Put ice in a plastic bag. °¨ Place a towel between your skin and the bag. °¨ Leave the ice on for 15-20 minutes, every 2-3 hours. Do this for the first 2 days after the injury.  °· Only take over-the-counter or prescription medicines as directed by your caregiver. °· Wrap the injured area with an elastic bandage as directed by your caregiver. °· Keep the injured leg raised (elevated). °· Walk, stretch, and perform range-of-motion exercises to improve blood flow to the injured area. Only perform these activities if you can do so without any pain. °To prevent muscle strains: °· Warm up before exercise. °· Develop proper conditioning and strength in the groin muscles. °SEEK IMMEDIATE MEDICAL CARE IF:  °· You have increased pain or swelling in the affected area.   °· Your symptoms are not improving or are getting worse. °MAKE SURE YOU:  °· Understand these instructions. °· Will watch your condition. °· Will get help right away if you are not doing well or get worse. °Document Released: 10/07/2003 Document Revised: 01/26/2012 Document Reviewed: 10/13/2011 °Exit[employer]® Patient Information ©2015 [employer]. This information is not intended to replace advice given to you   by your health care provider. Make sure you discuss any questions you have with your health care provider. ° °

## 2014-11-19 NOTE — ED Notes (Signed)
Groin pain to right side, rating pain 6/10.  Occasional vaginal, shooting pain.

## 2015-01-08 ENCOUNTER — Emergency Department (HOSPITAL_COMMUNITY): Payer: Medicaid Other

## 2015-01-08 ENCOUNTER — Emergency Department (HOSPITAL_COMMUNITY)
Admission: EM | Admit: 2015-01-08 | Discharge: 2015-01-08 | Disposition: A | Discharge status: Home / Self Care | Payer: Medicaid Other | Attending: Emergency Medicine | Admitting: Emergency Medicine

## 2015-01-08 ENCOUNTER — Encounter (HOSPITAL_COMMUNITY): Payer: Self-pay | Admitting: Emergency Medicine

## 2015-01-08 DIAGNOSIS — O23591 Infection of other part of genital tract in pregnancy, first trimester: Secondary | ICD-10-CM | POA: Insufficient documentation

## 2015-01-08 DIAGNOSIS — Z862 Personal history of diseases of the blood and blood-forming organs and certain disorders involving the immune mechanism: Secondary | ICD-10-CM | POA: Insufficient documentation

## 2015-01-08 DIAGNOSIS — O26899 Other specified pregnancy related conditions, unspecified trimester: Secondary | ICD-10-CM

## 2015-01-08 DIAGNOSIS — F17210 Nicotine dependence, cigarettes, uncomplicated: Secondary | ICD-10-CM | POA: Diagnosis not present

## 2015-01-08 DIAGNOSIS — M549 Dorsalgia, unspecified: Secondary | ICD-10-CM

## 2015-01-08 DIAGNOSIS — O99331 Smoking (tobacco) complicating pregnancy, first trimester: Secondary | ICD-10-CM | POA: Diagnosis not present

## 2015-01-08 DIAGNOSIS — R102 Pelvic and perineal pain: Secondary | ICD-10-CM

## 2015-01-08 DIAGNOSIS — R109 Unspecified abdominal pain: Secondary | ICD-10-CM

## 2015-01-08 DIAGNOSIS — B9689 Other specified bacterial agents as the cause of diseases classified elsewhere: Secondary | ICD-10-CM

## 2015-01-08 DIAGNOSIS — N760 Acute vaginitis: Secondary | ICD-10-CM

## 2015-01-08 LAB — PREGNANCY, URINE: Preg Test, Ur: POSITIVE — AB

## 2015-01-08 LAB — URINALYSIS, ROUTINE W REFLEX MICROSCOPIC
BILIRUBIN URINE: NEGATIVE
Glucose, UA: NEGATIVE mg/dL
Hgb urine dipstick: NEGATIVE
KETONES UR: NEGATIVE mg/dL
Leukocytes, UA: NEGATIVE
Nitrite: NEGATIVE
PH: 6.5 (ref 5.0–8.0)
Protein, ur: NEGATIVE mg/dL
SPECIFIC GRAVITY, URINE: 1.015 (ref 1.005–1.030)

## 2015-01-08 LAB — I-STAT BETA HCG BLOOD, ED (MC, WL, AP ONLY)

## 2015-01-08 LAB — WET PREP, GENITAL
SPERM: NONE SEEN
Trich, Wet Prep: NONE SEEN
Yeast Wet Prep HPF POC: NONE SEEN

## 2015-01-08 MED ORDER — PRENATAL COMPLETE 14-0.4 MG PO TABS: 1.0000 | ORAL_TABLET | Freq: Every day | ORAL | Status: DC

## 2015-01-08 MED ORDER — METRONIDAZOLE 500 MG PO TABS: 500.0000 mg | ORAL_TABLET | Freq: Two times a day (BID) | ORAL | Status: DC

## 2015-01-08 NOTE — ED Notes (Signed)
MD McManus at bedside. 

## 2015-01-08 NOTE — ED Provider Notes (Signed)
CSN: [id number]     Arrival date & time 01/08/15  1825 History   First MD Initiated Contact with Patient 01/08/15 1837     Chief Complaint  Patient presents with  . Abdominal Pain  . Back Pain      HPI  Pt was seen at 1845. Per pt, c/o gradual onset and persistence of constant pelvic "pain" that began this morning. Pt describes the pain as located in her suprapubic area, "cramping," and "sharp" with radiation into her lower back. Pt states she has been evaluated for similar pain previously, and "they didn't find anything wrong." Denies flank pain, no dysuria/hematuria, no vaginal bleeding/discharge, no N/V/D, no CP/SOB, no fevers, no rash.    Past Medical History  Diagnosis Date  . Anemia    History reviewed. No pertinent past surgical history.  Social History  Substance Use Topics  . Smoking status: Current Some Day Smoker -- 0.25 packs/day    Types: Cigarettes  . Smokeless tobacco: None  . Alcohol Use: No     Comment: socially    Review of Systems ROS: Statement: All systems negative except as marked or noted in the HPI; Constitutional: Negative for fever and chills. ; ; Eyes: Negative for eye pain, redness and discharge. ; ; ENMT: Negative for ear pain, hoarseness, nasal congestion, sinus pressure and sore throat. ; ; Cardiovascular: Negative for chest pain, palpitations, diaphoresis, dyspnea and peripheral edema. ; ; Respiratory: Negative for cough, wheezing and stridor. ; ; Gastrointestinal: Negative for nausea, vomiting, diarrhea, abdominal pain, blood in stool, hematemesis, jaundice and rectal bleeding. . ; ; Genitourinary: Negative for dysuria, flank pain and hematuria. ; ; GYN:  +pelvic pain. No vaginal bleeding, no vaginal discharge, no vulvar pain. ;; Musculoskeletal: Negative for back pain and neck pain. Negative for swelling and trauma.; ; Skin: Negative for pruritus, rash, abrasions, blisters, bruising and skin lesion.; ; Neuro: Negative for headache, lightheadedness and  neck stiffness. Negative for weakness, altered level of consciousness , altered mental status, extremity weakness, paresthesias, involuntary movement, seizure and syncope.      Allergies  Review of patient's allergies indicates no known allergies.  Home Medications   Prior to Admission medications   Medication Sig Start Date End Date Taking? Authorizing Provider  acetaminophen (TYLENOL) 500 MG tablet Take 1,500 mg by mouth every 6 (six) hours as needed for mild pain.     Historical Provider, MD  amoxicillin (AMOXIL) 500 MG capsule Take 1 capsule (500 mg total) by mouth 3 (three) times daily. Patient not taking: Reported on 11/19/2014 09/21/14   Ivery Quale, PA-C  diclofenac (VOLTAREN) 75 MG EC tablet Take 1 tablet (75 mg total) by mouth 2 (two) times daily. Patient not taking: Reported on 01/08/2015 11/19/14   Elson Areas, PA-C  ibuprofen (ADVIL,MOTRIN) 800 MG tablet Take 1 tablet (800 mg total) by mouth 3 (three) times daily. Patient not taking: Reported on 01/08/2015 09/21/14   Ivery Quale, PA-C  methocarbamol (ROBAXIN) 500 MG tablet Take 1 tablet (500 mg total) by mouth 2 (two) times daily. Patient not taking: Reported on 01/08/2015 11/19/14   Elson Areas, PA-C   BP 117/63 mmHg  Pulse 79  Temp(Src) 98.1 F (36.7 C) (Oral)  Resp 18  Ht  (1.651 m)  Wt 140 lb (63.504 kg)  BMI 23.30 kg/m2  SpO2 100%  LMP 12/07/2014 Physical Exam 1850: Physical examination:  Nursing notes reviewed; Vital signs and O2 SAT reviewed;  Constitutional: Well developed, Well nourished, Well hydrated,  In no acute distress; Head:  Normocephalic, atraumatic; Eyes: EOMI, PERRL, No scleral icterus; ENMT: Mouth and pharynx normal, Mucous membranes moist; Neck: Supple, Full range of motion, No lymphadenopathy; Cardiovascular: Regular rate and rhythm, No murmur, rub, or gallop; Respiratory: Breath sounds clear & equal bilaterally, No rales, rhonchi, wheezes.  Speaking full sentences with ease, Normal  respiratory effort/excursion; Chest: Nontender, Movement normal; Abdomen: Soft, +suprapubic tenderness to palp. No rebound or guarding. Nondistended, Normal bowel sounds; Genitourinary: No CVA tenderness. Pelvic exam performed with permission of pt and female ED tech assist during exam.  External genitalia w/o lesions. Vaginal vault with thick white discharge. No blood in vaginal vault. Cervix closed, no bleeding, no lesions, not friable, GC/chlam and wet prep obtained and sent to lab.  Bimanual exam w/o CMT, or left adnexal tenderness. +suprapubic and right pelvic tenderness; Spine:  No midline CS, TS, LS tenderness.;; Extremities: Pulses normal, No tenderness, No edema, No calf edema or asymmetry.; Neuro: AA&Ox3, Major CN grossly intact.  Speech clear. No gross focal motor or sensory deficits in extremities.; Skin: Color normal, Warm, Dry.   ED Course  Procedures (including critical care time) Labs Review   Imaging Review  I have personally reviewed and evaluated these images and lab results as part of my medical decision-making.   EKG Interpretation None      MDM  MDM Reviewed: previous chart, nursing note and vitals Interpretation: ultrasound and labs   Results for orders placed or performed during the hospital encounter of 01/08/15  Wet prep, genital  Result Value Ref Range   Yeast Wet Prep HPF POC NONE SEEN NONE SEEN   Trich, Wet Prep NONE SEEN NONE SEEN   Clue Cells Wet Prep HPF POC PRESENT (A) NONE SEEN   WBC, Wet Prep HPF POC MODERATE (A) NONE SEEN   Sperm NONE SEEN   Pregnancy, urine  Result Value Ref Range   Preg Test, Ur POSITIVE (A) NEGATIVE  Urinalysis, Routine w reflex microscopic  Result Value Ref Range   Color, Urine YELLOW YELLOW   APPearance CLEAR CLEAR   Specific Gravity, Urine 1.015 1.005 - 1.030   pH 6.5 5.0 - 8.0   Glucose, UA NEGATIVE NEGATIVE mg/dL   Hgb urine dipstick NEGATIVE NEGATIVE   Bilirubin Urine NEGATIVE NEGATIVE   Ketones, ur NEGATIVE  NEGATIVE mg/dL   Protein, ur NEGATIVE NEGATIVE mg/dL   Nitrite NEGATIVE NEGATIVE   Leukocytes, UA NEGATIVE NEGATIVE  I-Stat beta hCG blood, ED  Result Value Ref Range   I-stat hCG, quantitative >2000.0 (H) <5 mIU/mL   Comment 3            [place] Ob Comp Less 14 Wks 01/08/2015  CLINICAL DATA:  Acute onset right-sided pelvic pain today. Gestational age by LMP of 4 weeks 4 days. EXAM: OBSTETRIC <14 WK US AND TRANSVAGINAL OB US TECHNIQUE: Both transabdominal and transvaginal ultrasound examinations were performed for complete evaluation of the gestation as well as the maternal uterus, adnexal regions, and pelvic cul-de-sac. Transvaginal technique was performed to assess early pregnancy. COMPARISON:  None. FINDINGS: Intrauterine gestational sac: Visualized/normal in shape. Yolk sac:  Visualized Embryo:  Not visualized MSD: 6  mm   5 w   2  d Maternal uterus/adnexae: Both ovaries are normal in appearance. Small left ovarian corpus luteum incidentally noted. No mass or significant free fluid visualized. IMPRESSION: Single early approximately 5 week intrauterine gestational sac, which is concordant with LMP. Recommend followup of quantitative HCG levels, and consider followup ultrasound to assess  viability in 10 days. Electronically Signed   By: Myles Rosenthal M.D.   On: 01/08/2015 22:04    2205:  +IUP. +BV. No specific RLQ abd tenderness on initial and repeat abd exams.  Doubt appy at this time; return precautions given. Pt wants to go home now. Dx and testing d/w pt.  Questions answered.  Verb understanding, agreeable to d/c home with outpt f/u.    Samuel Jester, DO 01/12/15 1935

## 2015-01-08 NOTE — ED Notes (Signed)
Having pain to right lower abdomen, rates pain 7/10.  Pt says pain comes and goes.  Denies any injury.

## 2015-01-08 NOTE — Discharge Instructions (Signed)
°Emergency Department Resource Guide °1) Find a Doctor and Pay Out of Pocket °Although you won't have to find out who is covered by your insurance plan, it is a good idea to ask around and get recommendations. You will then need to call the office and see if the doctor you have chosen will accept you as a new patient and what types of options they offer for patients who are self-pay. Some doctors offer discounts or will set up payment plans for their patients who do not have insurance, but you will need to ask so you aren't surprised when you get to your appointment. ° °2) Contact Your Local Health Department °Not all health departments have doctors that can see patients for sick visits, but many do, so it is worth a call to see if yours does. If you don't know where your local health department is, you can check in your phone book. The CDC also has a tool to help you locate your state's health department, and many state websites also have listings of all of their local health departments. ° °3) Find a Walk-in Clinic °If your illness is not likely to be very severe or complicated, you may want to try a walk in clinic. These are popping up all over the country in pharmacies, drugstores, and shopping centers. They're usually staffed by nurse practitioners or physician assistants that have been trained to treat common illnesses and complaints. They're usually fairly quick and inexpensive. However, if you have serious medical issues or chronic medical problems, these are probably not your best option. ° °No Primary Care Doctor: °- Call Health Connect at  832-8000 - they can help you locate a primary care doctor that  accepts your insurance, provides certain services, etc. °- Physician Referral Service- 1-800-533-3463 ° °Chronic Pain Problems: °Organization         Address  Phone   Notes  °[facility]  (336) 297-2271 Patients need to be referred by their primary care doctor.  ° °Medication  Assistance: °Organization         Address  Phone   Notes  °[place] [facility] [address] °[place], [place] [address] (336) 641-8030 --Must be a resident of [place] °-- Must have NO insurance coverage whatsoever (no Medicaid/ Medicare, etc.) °-- The pt. MUST have a primary care doctor that directs their care regularly and follows them in the community °  °[facility]  (866) 331-1348   °[employer]  (888) 892-1162   ° °Agencies that provide inexpensive medical care: °Organization         Address  Phone   Notes  °[facility]  (336) 832-8035   °[facility]    (336) 832-7272   °[facility] [address] °[place], [place] [address] (336) 832-4777   °[facility] [address], °[place] (336) 271-4999   °[facility]    (336) 373-0678   °[facility]    (336) 272-1050   °[facility] ° [address], [place] Phone:  (336) 832-4444, Fax:  (336) 832-4440 Hours of Operation:  9 am - 6 pm, M-F.  Also accepts Medicaid/Medicare and self-pay.  °[facility] ° [address], [place] Phone: (336) 832-3150, Fax: (336) [phone]. Hours of Operation:  8:30 am - 5:30 pm, M-F.  Also accepts Medicaid and self-pay.  °[facility] [place] [address], [place] Phone: (336) 878-6027   °[facility] [address], [place], [place] (336)723-1848, Ext. 123 Mondays & Thursdays: 7-9 AM.  First 15 patients are seen on a first come, first serve basis. °  ° °Medicaid-accepting [place] Providers: ° °Organization         Address  Phone   Notes  °[facility] [address], [place] (336) 641-2100 Also accepts self-pay patients.  °[facility] [address], [place] ° (336) 856-9996   °[facility] [address], [place]  (336) 288-8857   °[facility] [address] [place] [address], [place] (336) 299-7000   °Veita Bland [address], [place]  ° (336) 373-1557 Only accepts [place] Access Medicaid patients after they have their name applied to their card.  ° °Self-Pay (no insurance) in [place]: ° °Organization         Address  Phone   Notes  °Sickle Cell Patients, [facility] [address], [place] (336) 832-1970   °[facility] [address], [place] (336) 832-4400   °[facility] ° [address], [place] (336) 992-4800   °[facility]/Dr. Osei-Bonsu ° [address] [place] [address], [place] or [address], [place] (336) 841-8500 Phone number for both [place] and [place] locations is the same.  °[facility] [address], [place] (336) 299-0000   °[facility] [address] [place] [address], [place] or [address] (336) 852-7530 °(336) 878-2260   °[facility] [address], [place] (336) 350-1642, phone; (336) 294-5005, fax Sees patients 1st and 3rd Saturday of every month.  Must not qualify for public or private insurance (i.e. Medicaid, Medicare, [place] Health Choice, Veterans' Benefits) • Household income should be no more than 200% of the poverty level •The clinic cannot treat you if you are pregnant or think you are pregnant • Sexually transmitted diseases are not treated at the clinic.  ° ° °Dental Care: °Organization         Address  Phone  Notes  °[place] [facility] [address], [place] (336) 641-6152 Accepts children up to age 21 who are enrolled in Medicaid or [place] Health Choice; pregnant women with a Medicaid card; and children who have applied for Medicaid or [place] Health Choice, but were declined, whose parents can pay a reduced fee at time of service.  °[place]  [facility] [place]  [address], [place] (336) 641-7733 Accepts children up to age 21 who are enrolled in Medicaid or [place] Health Choice; pregnant women with a Medicaid card; and children who have applied for Medicaid or [place] Health Choice, but were declined, whose parents can pay a reduced fee at time of service.  °[facility] ° [address], [place] (336) 641-4533 Patients are seen by appointment only. Walk-ins are not accepted. [facility] will see patients 18 years of age and older. °Monday - Tuesday (8am-5pm) °Most Wednesdays (8:30-5pm) °$30 per visit, cash only  °[facility] ° [address], [place] (336) 641-4533 Patients are seen by appointment only. Walk-ins are not accepted. [facility] will see patients 18 years of age and older. °One   Wednesday Evening (Monthly: Volunteer Based).  $30 per visit, cash only  °[facility]  (919) 537-3737 for adults; Children under age 4, call Graduate Pediatric Dentistry at (919) [phone]. Children aged 4-14, please call (919) [phone] to request a pediatric application. ° Dental services are provided in all areas of dental care including fillings, crowns and bridges, complete and partial dentures, implants, gum treatment, root canals, and extractions. Preventive care is also provided. Treatment is provided to both adults and children. °Patients are selected via a lottery and there is often a waiting list. °  °[facility] [address], °[place] ° (336) 763-8833 [email or web address] °  °[facility] [address], [place], [place] (336)723-1848, Ext. 123 Second and Fourth Thursday of each month, opens at 6:30 AM; Clinic ends at 9 AM.  Patients are seen on a first-come first-served basis, and a limited number are seen during each clinic.  ° °[facility] ° [address], [place], [place] (336) 723-7904    Eligibility Requirements °You must have lived in [place], [place], or [place] counties for at least the last three months. °  You cannot be eligible for state or federal sponsored healthcare insurance, including Veterans Administration, Medicaid, or Medicare. °  You generally cannot be eligible for healthcare insurance through your employer.  °  How to apply: °Eligibility screenings are held every Tuesday and Wednesday afternoon from 1:00 pm until 4:00 pm. You do not need an appointment for the interview!  °[facility] [address], [place], [place] 336-631-2330   °[facility]  336-342-8273   °[facility]  336-703-3100   °[facility]  336-570-6415   ° °Behavioral Health Resources in the Community: °Intensive Outpatient Programs °Organization         Address  Phone  Notes  °[facility] [address], [place], [place] 336-878-6098   °[facility] [address], [place], [place] 336-832-9800   °[facility] [address], [place], [place] ° 336-882-2125   °[facility] [address],  °[place], [place] 1-800-853-5163 or 336-641-4981   °Substance Abuse Resources °Organization         Address  Phone  Notes  °[facility]  336-882-2125   °[facility]  336-784-9470   °[facility]  336-285-9073   °[facility]  336-845-3988   °Residential & Outpatient Substance Abuse Program  1-800-659-3381   °Psychological Services °Organization         Address  Phone  Notes  °[facility]  336- 832-9600   °[facility]  336- 378-7881   °[facility] [address], [place] 1-800-853-5163 or 336-641-4981   ° °Mobile Crisis Teams °Organization         Address  Phone  Notes  °[facility], Mobile Crisis Care Unit  1-877-626-1772   °Assertive °[facility] ° [address]  [place], [place] 336-834-9664   °Sharon DeEsch [address] °[place] 336-554-5454   ° °Self-Help/Support Groups °Organization         Address  Phone             Notes  °[facility] of  - variety of support groups  336- 373-1402 Call for more information  °Narcotics Anonymous (NA), [facility] [address], °[place]  2 meetings at this location  ° °  Residential Treatment Programs Organization         Address  Phone  Notes  [facility] [address],    [place]  [phone])[phone]   [facility]  [address], [place] [phone], [place], [place] [phone]   [facility] [address], [place] [phone] Admissions: 8am-3pm M-F  [facility] [address]    [place], [place] [phone]   [facility] [address], [place], [place] [phone]   [facility] [address]  [place], [place] [phone]   [facility] Intensive Outpatient [address], [place], [place] [phone]   [facility] ([facility] [address]  [place], [place] [phone] or [phone]   [facility] ([facility] [address] [place], [place] [phone] Accepts Medicaid  [facility] [address]  [place] [phone] Substance Abuse/Addiction Treatment   [facility] Resources Organization         Address  Phone  Notes  [facility]  989-026-8592(888) [phone]   Angie FavaJulie Brannon, PhD [address], [address] [place], [place]   [phone](336) [phone] or [phone](336) [phone]   [facility]    [address] [place], [place] [phone](336) [phone]   [facility] [address], [place], [place] [phone]) [phone] Insurance/Medicaid/sponsorship through [facility]  [facility] [address]                                    [place], [place] [phone]) [phone] Therapy/tele-psych/case    [facility] [address] [place]   [place], [place] [phone](336) [phone]    Dr. Lolly MustacheArfeen  [phone](336) [phone]   [facility]  [employer] [facility] 1) [address], [place] 2) [address], [place] 3)  [address], [place] [phone](336) [phone](336) [phone]  [phone](336) [phone]   [facility] Child Abuse Hot[facility] (208)166-3563(336) 864-050-3240 or [phone](336) [phone] (After Hours)      Take the prescriptions as directed. Your ultrasound today showed a single intrauterine pregnancy appoximately 5 weeks and 762 days old.  Avoid strenuous activity and do NOT place anything into your vagina, ie: no douching, no tampons, no sexual intercourse, no swimming or tub baths, until you are seen in follow up by your regular OB/GYN doctor.  Call your regular OB/GYN doctor tomorrow to schedule a follow up appointment in the next 3 days.  Return to the Emergency Department immediately if worsening.

## 2015-01-10 LAB — GC/CHLAMYDIA PROBE AMP (~~LOC~~) NOT AT ARMC
Chlamydia: NEGATIVE
NEISSERIA GONORRHEA: NEGATIVE

## 2015-01-24 ENCOUNTER — Other Ambulatory Visit: Payer: Self-pay | Admitting: Obstetrics and Gynecology

## 2015-01-24 DIAGNOSIS — O3680X0 Pregnancy with inconclusive fetal viability, not applicable or unspecified: Secondary | ICD-10-CM

## 2015-01-27 ENCOUNTER — Ambulatory Visit (INDEPENDENT_AMBULATORY_CARE_PROVIDER_SITE_OTHER): Payer: Medicaid Other

## 2015-01-27 DIAGNOSIS — O3680X0 Pregnancy with inconclusive fetal viability, not applicable or unspecified: Secondary | ICD-10-CM

## 2015-01-27 NOTE — Progress Notes (Signed)
US 7+2wks,single IUP w/ ys,pos fht 154 bpm,normal ov's bilat,crl 14.99mm

## 2015-02-03 ENCOUNTER — Encounter: Payer: Medicaid Other | Admitting: Adult Health

## 2015-02-03 ENCOUNTER — Encounter: Payer: Self-pay | Admitting: *Deleted

## 2015-02-18 ENCOUNTER — Encounter: Payer: Medicaid Other | Admitting: Advanced Practice Midwife

## 2015-02-23 NOTE — L&D Delivery Note (Signed)
Delivery Note At 7:09 AM a viable female was delivered via  (Presentation: ; Occiput Anterior).  APGAR: pending ; weight pending .   Placenta status: Intact, Spontaneous.  Cord:  with the following complications: none .  Cord pH: not obtained  Anesthesia: Epidural  Episiotomy: None Lacerations: None Est. Blood Loss (mL): 200  Mom to postpartum.  Baby to Couplet care / Skin to Skin.  Cherrie Gauze Gayathri Futrell 09/13/2015, 7:22 AM

## 2015-02-26 ENCOUNTER — Ambulatory Visit (INDEPENDENT_AMBULATORY_CARE_PROVIDER_SITE_OTHER): Payer: Medicaid Other | Admitting: Women's Health

## 2015-02-26 ENCOUNTER — Encounter: Payer: Self-pay | Admitting: Women's Health

## 2015-02-26 VITALS — BP 106/52 | HR 80 | Ht 66.0 in | Wt 148.0 lb

## 2015-02-26 DIAGNOSIS — O09291 Supervision of pregnancy with other poor reproductive or obstetric history, first trimester: Secondary | ICD-10-CM | POA: Diagnosis not present

## 2015-02-26 DIAGNOSIS — Z8759 Personal history of other complications of pregnancy, childbirth and the puerperium: Secondary | ICD-10-CM | POA: Insufficient documentation

## 2015-02-26 DIAGNOSIS — Z331 Pregnant state, incidental: Secondary | ICD-10-CM

## 2015-02-26 DIAGNOSIS — Z23 Encounter for immunization: Secondary | ICD-10-CM | POA: Diagnosis not present

## 2015-02-26 DIAGNOSIS — Z369 Encounter for antenatal screening, unspecified: Secondary | ICD-10-CM

## 2015-02-26 DIAGNOSIS — Z1389 Encounter for screening for other disorder: Secondary | ICD-10-CM

## 2015-02-26 DIAGNOSIS — Z3491 Encounter for supervision of normal pregnancy, unspecified, first trimester: Secondary | ICD-10-CM | POA: Diagnosis not present

## 2015-02-26 DIAGNOSIS — Z3490 Encounter for supervision of normal pregnancy, unspecified, unspecified trimester: Secondary | ICD-10-CM | POA: Insufficient documentation

## 2015-02-26 DIAGNOSIS — Z0283 Encounter for blood-alcohol and blood-drug test: Secondary | ICD-10-CM

## 2015-02-26 LAB — POCT URINALYSIS DIPSTICK
Blood, UA: NEGATIVE
GLUCOSE UA: NEGATIVE
Ketones, UA: NEGATIVE
LEUKOCYTES UA: NEGATIVE
NITRITE UA: NEGATIVE

## 2015-02-26 MED ORDER — CITRANATAL ASSURE 35-1 & 300 MG PO MISC: ORAL | Status: DC

## 2015-02-26 MED ORDER — DOXYLAMINE-PYRIDOXINE 10-10 MG PO TBEC
DELAYED_RELEASE_TABLET | ORAL | Status: DC
Start: 2015-02-26 — End: 2015-07-17

## 2015-02-26 NOTE — Patient Instructions (Signed)

## 2015-02-26 NOTE — Progress Notes (Addendum)
  Subjective:  Cassandra Berg is a 25 y.o. 692P1001 African American female at 5174w4d by LMP c/w 7wk u/s, being seen today for her first obstetrical visit.  Her obstetrical history is significant for prev smoker- quit w/ +PT, term uncomplicated SVB x 1.  Upon review of prev delivery note: mild shoulder dystocia w/ 7lb15oz baby at 40.5wks. Pregnancy history fully reviewed.  Patient reports nausea- requests meds, no vomiting. Requests rx for pnv. Denies vb, cramping, uti s/s, abnormal/malodorous vag d/c, or vulvovaginal itching/irritation.  BP 106/52 mmHg  Pulse 80  Wt 148 lb (67.132 kg)  LMP 12/07/2014 (Exact Date)  HISTORY: OB History  Gravida Para Term Preterm AB SAB TAB Ectopic Multiple Living  2 1 1       2     # Outcome Date GA Lbr Len/2nd Weight Sex Delivery Anes PTL Lv  2 Current           1 Term 02/16/10   7 lb 15 oz (3.6 kg) M Vag-Spont EPI N Y     Past Medical History  Diagnosis Date  . Anemia    Past Surgical History  Procedure Laterality Date  . Wisdom tooth extraction     Family History  Problem Relation Age of Onset  . Heart disease Mother   . Stroke Mother   . Hypertension Mother   . Diabetes Father   . Cancer Maternal Grandmother     lung  . Diabetes Paternal Grandmother     Exam   System:     General: Well developed & nourished, no acute distress   Skin: Warm & dry, normal coloration and turgor, no rashes   Neurologic: Alert & oriented, normal mood   Cardiovascular: Regular rate & rhythm   Respiratory: Effort & rate normal, LCTAB, acyanotic   Abdomen: Soft, non tender   Extremities: normal strength, tone   Thin prep pap smear neg 2015 @ [facility]  FHR: 160 via doppler   Assessment:   Pregnancy: [id number] Patient Active Problem List   Diagnosis Date Noted  . Supervision of normal pregnancy 02/26/2015    Priority: High    4179w4d G2P1002 New OB visit Nausea of pregnancy Prev smoker- now quit H/O mild shoulder dystocia  Plan:  Initial labs  drawn Continue prenatal vitamins, rx sent Problem list reviewed and updated Reviewed n/v relief measures and warning s/s to report Rx diclegis, prior auth approved through [employer] today Reviewed recommended weight gain based on pre-gravid BMI Encouraged well-balanced diet Genetic Screening discussed Integrated Screen: declined Cystic fibrosis screening discussed declined Ultrasound discussed; fetal survey: requested Follow up in 4 weeks for visit [facility] completed Flu shot today  Marge DuncansBooker, Ladine Kiper Randall CNM, [facility]NP[facility]BC 02/26/2015 10:25 AM

## 2015-02-27 LAB — URINALYSIS, ROUTINE W REFLEX MICROSCOPIC
Bilirubin, UA: NEGATIVE
GLUCOSE, UA: NEGATIVE
KETONES UA: NEGATIVE
Leukocytes, UA: NEGATIVE
NITRITE UA: NEGATIVE
Protein, UA: NEGATIVE
RBC UA: NEGATIVE
SPEC GRAV UA: 1.028 (ref 1.005–1.030)
UUROB: 1 mg/dL (ref 0.2–1.0)
pH, UA: 6.5 (ref 5.0–7.5)

## 2015-02-27 LAB — PMP SCREEN PROFILE (10S), URINE
Amphetamine Screen, Ur: NEGATIVE ng/mL
BARBITURATE SCRN UR: NEGATIVE ng/mL
BENZODIAZEPINE SCREEN, URINE: NEGATIVE ng/mL
CREATININE(CRT), U: 267.2 mg/dL (ref 20.0–300.0)
Cannabinoids Ur Ql Scn: POSITIVE ng/mL
Cocaine(Metab.)Screen, Urine: NEGATIVE ng/mL
METHADONE SCREEN, URINE: NEGATIVE ng/mL
Opiate Scrn, Ur: NEGATIVE ng/mL
Oxycodone+Oxymorphone Ur Ql Scn: NEGATIVE ng/mL
PCP Scrn, Ur: NEGATIVE ng/mL
PROPOXYPHENE SCREEN: NEGATIVE ng/mL
Ph of Urine: 6.4 (ref 4.5–8.9)

## 2015-02-27 LAB — CBC
HEMATOCRIT: 35.1 % (ref 34.0–46.6)
HEMOGLOBIN: 11.6 g/dL (ref 11.1–15.9)
MCH: 29.5 pg (ref 26.6–33.0)
MCHC: 33 g/dL (ref 31.5–35.7)
MCV: 89 fL (ref 79–97)
Platelets: 243 10*3/uL (ref 150–379)
RBC: 3.93 x10E6/uL (ref 3.77–5.28)
RDW: 16.2 % — ABNORMAL HIGH (ref 12.3–15.4)
WBC: 7.4 10*3/uL (ref 3.4–10.8)

## 2015-02-27 LAB — ABO/RH: Rh Factor: POSITIVE

## 2015-02-27 LAB — ANTIBODY SCREEN: ANTIBODY SCREEN: NEGATIVE

## 2015-02-27 LAB — HEPATITIS B SURFACE ANTIGEN: HEP B S AG: NEGATIVE

## 2015-02-27 LAB — RPR: RPR: NONREACTIVE

## 2015-02-27 LAB — GC/CHLAMYDIA PROBE AMP
CHLAMYDIA, DNA PROBE: NEGATIVE
NEISSERIA GONORRHOEAE BY PCR: NEGATIVE

## 2015-02-27 LAB — SICKLE CELL SCREEN: Sickle Cell Screen: NEGATIVE

## 2015-02-27 LAB — RUBELLA SCREEN: RUBELLA: 2.36 {index} (ref >0.99)

## 2015-02-27 LAB — VARICELLA ZOSTER ANTIBODY, IGG: VARICELLA: 1474 {index} (ref >165)

## 2015-02-27 LAB — HIV ANTIBODY (ROUTINE TESTING W REFLEX): HIV SCREEN 4TH GENERATION: NONREACTIVE

## 2015-02-28 LAB — URINE CULTURE: Organism ID, Bacteria: NO GROWTH

## 2015-03-01 ENCOUNTER — Encounter: Payer: Self-pay | Admitting: Women's Health

## 2015-03-01 DIAGNOSIS — F1290 Cannabis use, unspecified, uncomplicated: Secondary | ICD-10-CM | POA: Insufficient documentation

## 2015-03-26 ENCOUNTER — Encounter: Payer: Self-pay | Admitting: Advanced Practice Midwife

## 2015-03-26 ENCOUNTER — Encounter: Payer: Medicaid Other | Admitting: Advanced Practice Midwife

## 2015-03-27 ENCOUNTER — Encounter (HOSPITAL_COMMUNITY): Payer: Self-pay | Admitting: Emergency Medicine

## 2015-03-27 ENCOUNTER — Emergency Department (HOSPITAL_COMMUNITY)
Admission: EM | Admit: 2015-03-27 | Discharge: 2015-03-27 | Disposition: A | Discharge status: Home / Self Care | Payer: Medicaid Other | Attending: Emergency Medicine | Admitting: Emergency Medicine

## 2015-03-27 DIAGNOSIS — O99512 Diseases of the respiratory system complicating pregnancy, second trimester: Secondary | ICD-10-CM | POA: Insufficient documentation

## 2015-03-27 DIAGNOSIS — Z3A16 16 weeks gestation of pregnancy: Secondary | ICD-10-CM | POA: Diagnosis not present

## 2015-03-27 DIAGNOSIS — R05 Cough: Secondary | ICD-10-CM

## 2015-03-27 DIAGNOSIS — Z862 Personal history of diseases of the blood and blood-forming organs and certain disorders involving the immune mechanism: Secondary | ICD-10-CM | POA: Diagnosis not present

## 2015-03-27 DIAGNOSIS — Z87891 Personal history of nicotine dependence: Secondary | ICD-10-CM | POA: Diagnosis not present

## 2015-03-27 DIAGNOSIS — R059 Cough, unspecified: Secondary | ICD-10-CM

## 2015-03-27 DIAGNOSIS — J0100 Acute maxillary sinusitis, unspecified: Secondary | ICD-10-CM | POA: Diagnosis not present

## 2015-03-27 MED ORDER — PSEUDOEPHEDRINE HCL 30 MG PO TABS: 30.0000 mg | ORAL_TABLET | Freq: Four times a day (QID) | ORAL | Status: DC | PRN

## 2015-03-27 MED ORDER — AZITHROMYCIN 250 MG PO TABS: 250.0000 mg | ORAL_TABLET | Freq: Every day | ORAL | Status: DC

## 2015-03-27 NOTE — ED Notes (Signed)
Pt states that she has had a lot of congestion for the past week with a cough.

## 2015-03-27 NOTE — ED Provider Notes (Signed)
CSN: [id number]     Arrival date & time 03/27/15  1059 History   First MD Initiated Contact with Patient 03/27/15 1129     Chief Complaint  Patient presents with  . Nasal Congestion     (Consider location/radiation/quality/duration/timing/severity/associated sxs/prior Treatment) Patient is a 25 y.o. female presenting with URI. The history is provided by the patient.  URI Presenting symptoms: congestion and cough   Presenting symptoms: no ear pain, no fever and no sore throat   Severity:  Moderate Onset quality:  Gradual Duration:  1 week Timing:  Intermittent Progression:  Worsening Chronicity:  New Relieved by:  None tried Worsened by:  Nothing tried Ineffective treatments:  None tried Associated symptoms: sinus pain    Cassandra Berg is a 25 y.o. G2P1001 @ [redacted]w[redacted]d gestation who presents to the ED with nasal congestion and cough that started a week ago and has gotten worse. Patient states she thought it was just a cold but it has continued. She did not take any medication due to begin pregnant and was afraid to take medication. She reports that the cough is productive with yellow/green sputum. She denies wheezing or shortness of breath.   Past Medical History  Diagnosis Date  . Anemia    Past Surgical History  Procedure Laterality Date  . Wisdom tooth extraction     Family History  Problem Relation Age of Onset  . Heart disease Mother   . Stroke Mother   . Hypertension Mother   . Diabetes Father   . Cancer Maternal Grandmother     lung  . Diabetes Paternal Grandmother    Social History  Substance Use Topics  . Smoking status: Former Smoker -- 0.25 packs/day    Types: Cigarettes  . Smokeless tobacco: None  . Alcohol Use: No     Comment: socially   OB History    Gravida Para Term Preterm AB TAB SAB Ectopic Multiple Living   Review of Systems  Constitutional: Negative for fever.  HENT: Positive for congestion. Negative for ear pain and sore  throat.   Respiratory: Positive for cough.   Genitourinary:       Pregnant  all other systems negative    Allergies  Review of patient's allergies indicates no known allergies.  Home Medications   Prior to Admission medications   Medication Sig Start Date End Date Taking? Authorizing Provider  acetaminophen (TYLENOL) 500 MG tablet Take 1,500 mg by mouth every 6 (six) hours as needed for mild pain. Reported on 02/26/2015    Historical Provider, MD  azithromycin (ZITHROMAX) 250 MG tablet Take 1 tablet (250 mg total) by mouth daily. Take first 2 tablets together, then 1 every day until finished. 03/27/15   Hope Orlene Och, NP  Doxylamine-Pyridoxine (DICLEGIS) 10-10 MG TBEC 2 tabs q hs, if sx persist add 1 tab q am on day 3, if sx persist add 1 tab q afternoon on day 4 02/26/15   Cheral Marker, CNM  Prenat w/o A-FeCbGl-DSS-FA-DHA [facility] ASSURE) 35-1 & 300 MG tablet One tablet and one capsule daily 02/26/15   Cheral Marker, CNM  Prenatal Vit-Fe Fumarate-FA (PRENATAL COMPLETE) 14-0.4 MG TABS Take 1 tablet by mouth daily. 01/08/15   Samuel Jester, DO  pseudoephedrine (SUDAFED) 30 MG tablet Take 1 tablet (30 mg total) by mouth every 6 (six) hours as needed for congestion. 03/27/15   Hope Orlene Och, NP  BP 106/68 mmHg  Pulse 85  Temp(Src) 98.4 F (36.9 C) (Oral)  Resp 16  Ht  (1.651 m)  Wt 66.588 kg  BMI 24.43 kg/m2  SpO2 100%  LMP 12/07/2014 (Exact Date) Physical Exam  Constitutional: She is oriented to person, place, and time. She appears well-developed and well-nourished. No distress.  HENT:  Head: Normocephalic and atraumatic.  Right Ear: Tympanic membrane normal.  Left Ear: Tympanic membrane normal.  Nose: Right sinus exhibits maxillary sinus tenderness. Left sinus exhibits maxillary sinus tenderness.  Mouth/Throat: Uvula is midline, oropharynx is clear and moist and mucous membranes are normal.  Eyes: Conjunctivae and EOM are normal. Pupils are equal, round, and reactive  to light.  Neck: Neck supple.  Cardiovascular: Normal rate and regular rhythm.   Pulmonary/Chest: Effort normal and breath sounds normal. She has no wheezes. She has no rales.  Abdominal: Soft. There is no tenderness.  Doppler FHT's 154  Musculoskeletal: Normal range of motion.  Neurological: She is alert and oriented to person, place, and time. No cranial nerve deficit.  Skin: Skin is warm and dry.  Psychiatric: She has a normal mood and affect. Her behavior is normal.  Nursing note and vitals reviewed.   ED Course  Procedures   MDM  25 y.o. female @ [redacted]w[redacted]d gestation with cough and congestion x 1 week. Will treat with Z-pak and sudafed. She will follow up with Dr. Rayna Sexton office or return here for worsening symptoms.   Final diagnoses:  Acute maxillary sinusitis, recurrence not specified  Cough       Janne Napoleon, NP 03/27/15 1153  Bethann Berkshire, MD 03/28/15 [phone]

## 2015-03-27 NOTE — ED Notes (Signed)
PA at bedside.

## 2015-04-21 ENCOUNTER — Ambulatory Visit (INDEPENDENT_AMBULATORY_CARE_PROVIDER_SITE_OTHER): Payer: Medicaid Other | Admitting: Women's Health

## 2015-04-21 ENCOUNTER — Encounter: Payer: Self-pay | Admitting: Women's Health

## 2015-04-21 VITALS — BP 112/54 | HR 96 | Wt 152.0 lb

## 2015-04-21 DIAGNOSIS — Z331 Pregnant state, incidental: Secondary | ICD-10-CM

## 2015-04-21 DIAGNOSIS — Z363 Encounter for antenatal screening for malformations: Secondary | ICD-10-CM

## 2015-04-21 DIAGNOSIS — Z3A20 20 weeks gestation of pregnancy: Secondary | ICD-10-CM

## 2015-04-21 DIAGNOSIS — Z1389 Encounter for screening for other disorder: Secondary | ICD-10-CM

## 2015-04-21 DIAGNOSIS — Z3492 Encounter for supervision of normal pregnancy, unspecified, second trimester: Secondary | ICD-10-CM

## 2015-04-21 NOTE — Patient Instructions (Signed)

## 2015-04-21 NOTE — Progress Notes (Signed)
Low-risk OB appointment G2P1001 [redacted]w[redacted]d Estimated Date of Delivery: 09/13/15 BP 112/54 mmHg  Pulse 96  Wt 152 lb (68.947 kg)  LMP 12/07/2014 (Exact Date)  BP, weight reviewed. Unable to void- voided right before she came.  Refer to obstetrical flow sheet for FH & FHR. No care since 11wks, states she was out of town, then forgot about rescheduling.  Reports good fm.  Denies regular uc's, lof, vb, or uti s/s. No complaints. Reviewed ptl s/s, fm. Plan:  Continue routine obstetrical care  F/U in 1wk for anatomy u/s (no visit), then 4wks for OB appointment

## 2015-04-28 ENCOUNTER — Ambulatory Visit (INDEPENDENT_AMBULATORY_CARE_PROVIDER_SITE_OTHER): Payer: Medicaid Other

## 2015-04-28 DIAGNOSIS — Z3A21 21 weeks gestation of pregnancy: Secondary | ICD-10-CM

## 2015-04-28 DIAGNOSIS — Z363 Encounter for antenatal screening for malformations: Secondary | ICD-10-CM

## 2015-04-28 DIAGNOSIS — Z36 Encounter for antenatal screening of mother: Secondary | ICD-10-CM | POA: Diagnosis not present

## 2015-04-28 DIAGNOSIS — Z3492 Encounter for supervision of normal pregnancy, unspecified, second trimester: Secondary | ICD-10-CM

## 2015-04-28 NOTE — Progress Notes (Signed)
US 20+2wks,cephalic,normal ov's bilat,post pl gr 0,cx 4.7 cm,svp of fluid 6cm,fhr 142 bpm,LVEICF 2.2 mm,efw 405 g,measurement c/w dates,anatomy complete

## 2015-05-19 ENCOUNTER — Encounter: Payer: Medicaid Other | Admitting: Obstetrics & Gynecology

## 2015-05-26 ENCOUNTER — Encounter: Payer: Medicaid Other | Admitting: Obstetrics & Gynecology

## 2015-06-11 ENCOUNTER — Ambulatory Visit (INDEPENDENT_AMBULATORY_CARE_PROVIDER_SITE_OTHER): Payer: Medicaid Other | Admitting: Advanced Practice Midwife

## 2015-06-11 VITALS — BP 92/58 | HR 70 | Wt 160.0 lb

## 2015-06-11 DIAGNOSIS — O283 Abnormal ultrasonic finding on antenatal screening of mother: Secondary | ICD-10-CM

## 2015-06-11 DIAGNOSIS — Z1389 Encounter for screening for other disorder: Secondary | ICD-10-CM

## 2015-06-11 DIAGNOSIS — Z331 Pregnant state, incidental: Secondary | ICD-10-CM

## 2015-06-11 DIAGNOSIS — Z3492 Encounter for supervision of normal pregnancy, unspecified, second trimester: Secondary | ICD-10-CM

## 2015-06-11 LAB — POCT URINALYSIS DIPSTICK
GLUCOSE UA: NEGATIVE
KETONES UA: NEGATIVE
Leukocytes, UA: NEGATIVE
NITRITE UA: NEGATIVE
RBC UA: NEGATIVE

## 2015-06-11 MED ORDER — CITRANATAL ASSURE 35-1 & 300 MG PO MISC: ORAL | Status: DC

## 2015-06-11 NOTE — Progress Notes (Signed)
G2P1001 4148w4d Estimated Date of Delivery: 09/13/15  Blood pressure 92/58, pulse 70, weight 160 lb (72.576 kg), last menstrual period 12/07/2014.   BP weight and urine results all reviewed and noted.  Please refer to the obstetrical flow sheet for the fundal height and fetal heart rate documentation:  Patient reports good fetal movement, denies any bleeding and no rupture of membranes symptoms or regular contractions. Patient is without complaints. All questions were answered.  Orders Placed This Encounter  Procedures  . US OB Follow Up  . POCT urinalysis dipstick    Plan:  Continued routine obstetrical care,   Return in about 2 weeks (around 06/25/2015) for PN2/LROB, US:OB F/U:.Lt EICF (no genetic screening)

## 2015-06-11 NOTE — Patient Instructions (Signed)

## 2015-06-27 ENCOUNTER — Other Ambulatory Visit: Payer: Medicaid Other

## 2015-06-27 ENCOUNTER — Encounter: Payer: Self-pay | Admitting: Obstetrics & Gynecology

## 2015-06-27 ENCOUNTER — Ambulatory Visit (INDEPENDENT_AMBULATORY_CARE_PROVIDER_SITE_OTHER): Payer: Medicaid Other | Admitting: Obstetrics & Gynecology

## 2015-06-27 ENCOUNTER — Ambulatory Visit (INDEPENDENT_AMBULATORY_CARE_PROVIDER_SITE_OTHER): Payer: Medicaid Other

## 2015-06-27 VITALS — BP 90/50 | HR 72 | Wt 163.0 lb

## 2015-06-27 DIAGNOSIS — O283 Abnormal ultrasonic finding on antenatal screening of mother: Secondary | ICD-10-CM | POA: Diagnosis not present

## 2015-06-27 DIAGNOSIS — Z331 Pregnant state, incidental: Secondary | ICD-10-CM

## 2015-06-27 DIAGNOSIS — Z131 Encounter for screening for diabetes mellitus: Secondary | ICD-10-CM

## 2015-06-27 DIAGNOSIS — Z3493 Encounter for supervision of normal pregnancy, unspecified, third trimester: Secondary | ICD-10-CM

## 2015-06-27 DIAGNOSIS — Z369 Encounter for antenatal screening, unspecified: Secondary | ICD-10-CM

## 2015-06-27 DIAGNOSIS — Z1389 Encounter for screening for other disorder: Secondary | ICD-10-CM

## 2015-06-27 DIAGNOSIS — Z3492 Encounter for supervision of normal pregnancy, unspecified, second trimester: Secondary | ICD-10-CM

## 2015-06-27 LAB — POCT URINALYSIS DIPSTICK
GLUCOSE UA: NEGATIVE
KETONES UA: NEGATIVE
LEUKOCYTES UA: NEGATIVE
NITRITE UA: NEGATIVE
Protein, UA: NEGATIVE
RBC UA: NEGATIVE

## 2015-06-27 NOTE — Progress Notes (Signed)
US 28+6 wks,cephalic,cx 3.2 cm,post pl gr 0,normal ov's bilat, LVEICF N/C,AFI 11 cm,efw 1503 g 70%

## 2015-06-27 NOTE — Progress Notes (Signed)
G2P1001 2521w6d Estimated Date of Delivery: 09/13/15  Blood pressure 90/50, pulse 72, weight 163 lb (73.936 kg), last menstrual period 12/07/2014.   BP weight and urine results all reviewed and noted.  Please refer to the obstetrical flow sheet for the fundal height and fetal heart rate documentation:  Patient reports good fetal movement, denies any bleeding and no rupture of membranes symptoms or regular contractions. Patient is without complaints. All questions were answered.  Orders Placed This Encounter  Procedures  . POCT urinalysis dipstick    Plan:  Continued routine obstetrical care,   No Follow-up on file.

## 2015-06-28 LAB — GLUCOSE TOLERANCE, 2 HOURS W/ 1HR
GLUCOSE, 1 HOUR: 92 mg/dL (ref 65–179)
GLUCOSE, 2 HOUR: 90 mg/dL (ref 65–152)
GLUCOSE, FASTING: 82 mg/dL (ref 65–91)

## 2015-06-28 LAB — CBC
HEMATOCRIT: 27.3 % — AB (ref 34.0–46.6)
HEMOGLOBIN: 9.4 g/dL — AB (ref 11.1–15.9)
MCH: 30.1 pg (ref 26.6–33.0)
MCHC: 34.4 g/dL (ref 31.5–35.7)
MCV: 88 fL (ref 79–97)
PLATELETS: 227 10*3/uL (ref 150–379)
RBC: 3.12 x10E6/uL — AB (ref 3.77–5.28)
RDW: 13.6 % (ref 12.3–15.4)
WBC: 8.7 10*3/uL (ref 3.4–10.8)

## 2015-06-28 LAB — HIV ANTIBODY (ROUTINE TESTING W REFLEX): HIV Screen 4th Generation wRfx: NONREACTIVE

## 2015-06-28 LAB — RPR: RPR Ser Ql: NONREACTIVE

## 2015-06-28 LAB — ANTIBODY SCREEN: Antibody Screen: NEGATIVE

## 2015-07-17 ENCOUNTER — Encounter: Payer: Self-pay | Admitting: Obstetrics & Gynecology

## 2015-07-17 ENCOUNTER — Ambulatory Visit (INDEPENDENT_AMBULATORY_CARE_PROVIDER_SITE_OTHER): Payer: Medicaid Other | Admitting: Obstetrics & Gynecology

## 2015-07-17 ENCOUNTER — Encounter: Payer: Medicaid Other | Admitting: Advanced Practice Midwife

## 2015-07-17 VITALS — BP 108/60 | HR 98 | Wt 169.0 lb

## 2015-07-17 DIAGNOSIS — Z331 Pregnant state, incidental: Secondary | ICD-10-CM

## 2015-07-17 DIAGNOSIS — Z3493 Encounter for supervision of normal pregnancy, unspecified, third trimester: Secondary | ICD-10-CM

## 2015-07-17 DIAGNOSIS — Z1389 Encounter for screening for other disorder: Secondary | ICD-10-CM

## 2015-07-17 DIAGNOSIS — Z3A32 32 weeks gestation of pregnancy: Secondary | ICD-10-CM

## 2015-07-17 DIAGNOSIS — Z3483 Encounter for supervision of other normal pregnancy, third trimester: Secondary | ICD-10-CM

## 2015-07-17 LAB — POCT URINALYSIS DIPSTICK
Blood, UA: NEGATIVE
Glucose, UA: NEGATIVE
Ketones, UA: NEGATIVE
LEUKOCYTES UA: NEGATIVE
Nitrite, UA: NEGATIVE
PROTEIN UA: NEGATIVE

## 2015-07-17 NOTE — Progress Notes (Signed)
G2P1001 385w5d Estimated Date of Delivery: 09/13/15  Blood pressure 108/60, pulse 98, weight 169 lb (76.658 kg), last menstrual period 12/07/2014.   BP weight and urine results all reviewed and noted.  Please refer to the obstetrical flow sheet for the fundal height and fetal heart rate documentation:  Patient reports good fetal movement, denies any bleeding and no rupture of membranes symptoms or regular contractions. Patient is without complaints. All questions were answered.  Orders Placed This Encounter  Procedures  . POCT urinalysis dipstick    Plan:  Continued routine obstetrical care,   No Follow-up on file.

## 2015-07-31 ENCOUNTER — Encounter: Payer: Self-pay | Admitting: Obstetrics and Gynecology

## 2015-07-31 ENCOUNTER — Ambulatory Visit (INDEPENDENT_AMBULATORY_CARE_PROVIDER_SITE_OTHER): Payer: Medicaid Other | Admitting: Obstetrics and Gynecology

## 2015-07-31 VITALS — BP 100/60 | HR 86 | Wt 169.0 lb

## 2015-07-31 DIAGNOSIS — Z3A34 34 weeks gestation of pregnancy: Secondary | ICD-10-CM

## 2015-07-31 DIAGNOSIS — Z331 Pregnant state, incidental: Secondary | ICD-10-CM

## 2015-07-31 DIAGNOSIS — Z3493 Encounter for supervision of normal pregnancy, unspecified, third trimester: Secondary | ICD-10-CM

## 2015-07-31 DIAGNOSIS — Z3483 Encounter for supervision of other normal pregnancy, third trimester: Secondary | ICD-10-CM

## 2015-07-31 DIAGNOSIS — Z1389 Encounter for screening for other disorder: Secondary | ICD-10-CM

## 2015-07-31 LAB — POCT URINALYSIS DIPSTICK
Blood, UA: NEGATIVE
Glucose, UA: NEGATIVE
KETONES UA: NEGATIVE
LEUKOCYTES UA: NEGATIVE
Nitrite, UA: NEGATIVE
Protein, UA: NEGATIVE

## 2015-07-31 NOTE — Progress Notes (Signed)
G2P1001 5050w5d Estimated Date of Delivery: 09/13/15  Blood pressure 100/60, pulse 86, weight 169 lb (76.658 kg), last menstrual period 12/07/2014.   refer to the ob flow sheet for FH and FHR, also BP, Wt, Urine results:notable for neg protein  Patient reports   good fetal movement, denies any bleeding and no rupture of membranes symptoms or regular contractions. Patient complaints:none ; happy with plan for bottle fdg, and DepoProvera.declines lactation consultant help  Questions were answered. Assessment: LROB G2P1001 @ 7550w5d  / bottle fdg/  depoProvera  Plan:  Continued routine obstetrical care,   F/u in 2 weeks for lrob

## 2015-07-31 NOTE — Progress Notes (Signed)
Pt denies any problems or concerns at this time.  

## 2015-08-08 ENCOUNTER — Encounter (HOSPITAL_COMMUNITY): Payer: Self-pay | Admitting: *Deleted

## 2015-08-08 ENCOUNTER — Inpatient Hospital Stay (HOSPITAL_COMMUNITY)
Admission: AD | Admit: 2015-08-08 | Discharge: 2015-08-08 | Disposition: A | Discharge status: Home / Self Care | Payer: Medicaid Other | Source: Ambulatory Visit | Attending: Obstetrics & Gynecology | Admitting: Obstetrics & Gynecology

## 2015-08-08 DIAGNOSIS — O26893 Other specified pregnancy related conditions, third trimester: Secondary | ICD-10-CM | POA: Diagnosis not present

## 2015-08-08 DIAGNOSIS — N898 Other specified noninflammatory disorders of vagina: Secondary | ICD-10-CM | POA: Insufficient documentation

## 2015-08-08 DIAGNOSIS — Z3A35 35 weeks gestation of pregnancy: Secondary | ICD-10-CM | POA: Insufficient documentation

## 2015-08-08 DIAGNOSIS — Z87891 Personal history of nicotine dependence: Secondary | ICD-10-CM | POA: Diagnosis not present

## 2015-08-08 LAB — URINALYSIS, ROUTINE W REFLEX MICROSCOPIC
BILIRUBIN URINE: NEGATIVE
Glucose, UA: NEGATIVE mg/dL
Hgb urine dipstick: NEGATIVE
KETONES UR: NEGATIVE mg/dL
LEUKOCYTES UA: NEGATIVE
NITRITE: NEGATIVE
PROTEIN: NEGATIVE mg/dL
Specific Gravity, Urine: 1.02 (ref 1.005–1.030)
pH: 8 (ref 5.0–8.0)

## 2015-08-08 LAB — WET PREP, GENITAL
CLUE CELLS WET PREP: NONE SEEN
Sperm: NONE SEEN
TRICH WET PREP: NONE SEEN
Yeast Wet Prep HPF POC: NONE SEEN

## 2015-08-08 NOTE — MAU Provider Note (Signed)
History   [id number]   Chief Complaint  Patient presents with  . R/O ROM     HPI Cassandra Berg is a 25 y.o. female  G2P1001 here with report of watery vaginal discharge that began at approximately 12 noon today.  Leaking of fluid has not continued.  Pt reports contractions and denies vaginal bleeding.   +fetal movement.   All other systems negative.    Patient's last menstrual period was 12/07/2014 (exact date).  OB History  Gravida Para Term Preterm AB SAB TAB Ectopic Multiple Living  # Outcome Date GA Lbr Len/2nd Weight Sex Delivery Anes PTL Lv  2 Current           1 Term 02/16/10 [redacted]w[redacted]d  7 lb 15 oz (3.6 kg) M Vag-Spont EPI N Y      Past Medical History  Diagnosis Date  . Anemia     Family History  Problem Relation Age of Onset  . Heart disease Mother   . Stroke Mother   . Hypertension Mother   . Diabetes Father   . Cancer Maternal Grandmother     lung  . Diabetes Paternal Grandmother     Social History   Social History  . Marital Status: Single    Spouse Name: N/A  . Number of Children: N/A  . Years of Education: N/A   Social History Main Topics  . Smoking status: Former Smoker -- 0.25 packs/day    Types: Cigarettes  . Smokeless tobacco: None  . Alcohol Use: No     Comment: socially  . Drug Use: No  . Sexual Activity: Yes    Birth Control/ Protection: None   Other Topics Concern  . None   Social History Narrative    No Known Allergies  No current facility-administered medications on file prior to encounter.   Current Outpatient Prescriptions on File Prior to Encounter  Medication Sig Dispense Refill  . acetaminophen (TYLENOL) 500 MG tablet Take 1,500 mg by mouth every 6 (six) hours as needed for mild pain. Reported on 07/17/2015    . ferrous sulfate 325 (65 FE) MG tablet Take 325 mg by mouth daily with breakfast.    . Prenat w/o A-FeCbGl-DSS-FA-DHA (CITRANATAL ASSURE) 35-1 & 300 MG tablet One tablet and one capsule daily 60  tablet 11     Review of Systems  Constitutional: Negative.   Gastrointestinal: Negative for abdominal pain.  Genitourinary: Positive for vaginal discharge. Negative for vaginal bleeding.     Physical Exam   Filed Vitals:   08/08/15 1437  BP: 111/63  Pulse: 105  Temp: 98.5 F (36.9 C)  TempSrc: Oral  Resp: 18    Physical Exam  Nursing note and vitals reviewed. Constitutional: She is oriented to person, place, and time. She appears well-developed and well-nourished. No distress.  HENT:  Head: Normocephalic and atraumatic.  Eyes: Conjunctivae are normal. Right eye exhibits no discharge. Left eye exhibits no discharge. No scleral icterus.  Neck: Normal range of motion.  Cardiovascular:  No murmur heard. Respiratory: Effort normal. No respiratory distress.  GI: Soft. There is no tenderness.  Genitourinary: Vaginal discharge (small amount of white mucoid discharge) found.  No pooling  Neurological: She is alert and oriented to person, place, and time.  Skin: Skin is warm and dry. She is not diaphoretic.  Psychiatric: She has a normal mood and affect. Her behavior is normal. Judgment and thought content normal.  Fetal Tracing:  Baseline: 130 Variability: moderate Accelerations: 15x15 Decelerations: none  Toco: none    MAU Course  Procedures Component     Latest Ref Rng 08/08/2015  Yeast Wet Prep HPF POC     NONE SEEN NONE SEEN  Trich, Wet Prep     NONE SEEN NONE SEEN  Clue Cells Wet Prep HPF POC     NONE SEEN NONE SEEN  WBC, Wet Prep HPF POC     NONE SEEN FEW (A)  Sperm      NONE SEEN   Component     Latest Ref Rng 08/08/2015          Color, Urine     YELLOW YELLOW  Appearance     CLEAR CLEAR  Specific Gravity, Urine     1.005 - 1.030 1.020  pH     5.0 - 8.0 8.0  Glucose     NEGATIVE mg/dL NEGATIVE  Hgb urine dipstick     NEGATIVE NEGATIVE  Bilirubin Urine     NEGATIVE NEGATIVE  Ketones, ur     NEGATIVE mg/dL NEGATIVE  Protein      NEGATIVE mg/dL NEGATIVE  Nitrite     NEGATIVE NEGATIVE  Leukocytes, UA     NEGATIVE NEGATIVE    MDM Reactive tracing No pooling Fern negative Wet prep negative  Assessment and Plan  A: 1. Vaginal discharge during pregnancy in third trimester     P: Discharge home Discussed reasons to return to MAU Keep f/u with OB   Judeth HornErin Myan Locatelli, NP 08/08/2015 2:44 PM

## 2015-08-08 NOTE — MAU Note (Signed)
States she felt a small gush/leak of fluid around 1200 X 1. Has not continued to leak.

## 2015-08-08 NOTE — Discharge Instructions (Signed)

## 2015-08-08 NOTE — MAU Note (Signed)
Urine sent to lab 

## 2015-08-14 ENCOUNTER — Ambulatory Visit (INDEPENDENT_AMBULATORY_CARE_PROVIDER_SITE_OTHER): Payer: Medicaid Other | Admitting: Advanced Practice Midwife

## 2015-08-14 ENCOUNTER — Encounter: Payer: Self-pay | Admitting: Advanced Practice Midwife

## 2015-08-14 VITALS — BP 108/58 | HR 92 | Wt 175.0 lb

## 2015-08-14 DIAGNOSIS — Z331 Pregnant state, incidental: Secondary | ICD-10-CM

## 2015-08-14 DIAGNOSIS — Z1389 Encounter for screening for other disorder: Secondary | ICD-10-CM

## 2015-08-14 DIAGNOSIS — O09293 Supervision of pregnancy with other poor reproductive or obstetric history, third trimester: Secondary | ICD-10-CM

## 2015-08-14 DIAGNOSIS — Z3493 Encounter for supervision of normal pregnancy, unspecified, third trimester: Secondary | ICD-10-CM

## 2015-08-14 DIAGNOSIS — F1290 Cannabis use, unspecified, uncomplicated: Secondary | ICD-10-CM

## 2015-08-14 DIAGNOSIS — F1210 Cannabis abuse, uncomplicated: Secondary | ICD-10-CM

## 2015-08-14 DIAGNOSIS — O1213 Gestational proteinuria, third trimester: Secondary | ICD-10-CM

## 2015-08-14 LAB — POCT URINALYSIS DIPSTICK
GLUCOSE UA: NEGATIVE
KETONES UA: NEGATIVE
Leukocytes, UA: NEGATIVE
Nitrite, UA: NEGATIVE
RBC UA: NEGATIVE

## 2015-08-14 NOTE — Patient Instructions (Signed)

## 2015-08-14 NOTE — Progress Notes (Signed)
G2P1001 4580w5d Estimated Date of Delivery: 09/13/15  Blood pressure 108/58, pulse 92, weight 175 lb (79.379 kg), last menstrual period 12/07/2014.   BP weight and urine results all reviewed and noted.  Has a little bit of urgeency, otherwise, no UTI sx Please refer to the obstetrical flow sheet for the fundal height and fetal heart rate documentation:  Patient reports good fetal movement, denies any bleeding and no rupture of membranes symptoms or regular contractions. Patient is without complaints. All questions were answered.  Orders Placed This Encounter  Procedures  . Urine culture  . US OB Follow Up  . Pain Management Screening Profile (10S)  . POCT urinalysis dipstick    Plan:  Continued routine obstetrical care, culture urine  Return in about 1 week (around 08/21/2015) for LROB, US:EFW.

## 2015-08-16 LAB — URINE CULTURE: ORGANISM ID, BACTERIA: NO GROWTH

## 2015-08-19 LAB — PMP SCREEN PROFILE (10S), URINE

## 2015-08-21 ENCOUNTER — Encounter: Payer: Self-pay | Admitting: Advanced Practice Midwife

## 2015-08-21 ENCOUNTER — Ambulatory Visit (INDEPENDENT_AMBULATORY_CARE_PROVIDER_SITE_OTHER): Payer: Medicaid Other

## 2015-08-21 ENCOUNTER — Ambulatory Visit (INDEPENDENT_AMBULATORY_CARE_PROVIDER_SITE_OTHER): Payer: Medicaid Other | Admitting: Advanced Practice Midwife

## 2015-08-21 VITALS — BP 100/60 | HR 90 | Wt 175.0 lb

## 2015-08-21 DIAGNOSIS — O09293 Supervision of pregnancy with other poor reproductive or obstetric history, third trimester: Secondary | ICD-10-CM

## 2015-08-21 DIAGNOSIS — Z3A37 37 weeks gestation of pregnancy: Secondary | ICD-10-CM

## 2015-08-21 DIAGNOSIS — Z369 Encounter for antenatal screening, unspecified: Secondary | ICD-10-CM

## 2015-08-21 DIAGNOSIS — Z331 Pregnant state, incidental: Secondary | ICD-10-CM

## 2015-08-21 DIAGNOSIS — Z3A36 36 weeks gestation of pregnancy: Secondary | ICD-10-CM

## 2015-08-21 DIAGNOSIS — Z3483 Encounter for supervision of other normal pregnancy, third trimester: Secondary | ICD-10-CM

## 2015-08-21 DIAGNOSIS — Z3493 Encounter for supervision of normal pregnancy, unspecified, third trimester: Secondary | ICD-10-CM

## 2015-08-21 DIAGNOSIS — Z1389 Encounter for screening for other disorder: Secondary | ICD-10-CM

## 2015-08-21 LAB — POCT URINALYSIS DIPSTICK
Blood, UA: NEGATIVE
GLUCOSE UA: NEGATIVE
Ketones, UA: NEGATIVE
LEUKOCYTES UA: NEGATIVE
NITRITE UA: NEGATIVE

## 2015-08-21 NOTE — Progress Notes (Signed)
G2P1001 3943w5d Estimated Date of Delivery: 09/13/15  Blood pressure 100/60, pulse 90, weight 175 lb (79.379 kg), last menstrual period 12/07/2014.   BP weight and urine results all reviewed and noted.  Please refer to the obstetrical flow sheet for the fundal height and fetal heart rate documentation: US 36+5 wks,cephalic,fhr 166 bpm,afi 13 cm,post pl gr 1,normal ov's bilat,efw 3277 g 71%           Patient reports good fetal movement, denies any bleeding and no rupture of membranes symptoms or regular contractions. Patient is without complaints. All questions were answered.  Orders Placed This Encounter  Procedures  . GC/Chlamydia Probe Amp  . Strep Gp B NAA  . POCT urinalysis dipstick    Plan:  Continued routine obstetrical care,   Return in about 1 week (around 08/28/2015) for LROB.

## 2015-08-21 NOTE — Progress Notes (Signed)
US 36+5 wks,cephalic,fhr 166 bpm,afi 13 cm,post pl gr 1,normal ov's bilat,efw 3277 g 71%

## 2015-08-21 NOTE — Patient Instructions (Signed)

## 2015-08-23 LAB — GC/CHLAMYDIA PROBE AMP
Chlamydia trachomatis, NAA: NEGATIVE
NEISSERIA GONORRHOEAE BY PCR: NEGATIVE

## 2015-08-23 LAB — STREP GP B NAA: STREP GROUP B AG: NEGATIVE

## 2015-08-28 ENCOUNTER — Encounter: Payer: Medicaid Other | Admitting: Obstetrics & Gynecology

## 2015-09-02 ENCOUNTER — Ambulatory Visit (INDEPENDENT_AMBULATORY_CARE_PROVIDER_SITE_OTHER): Payer: Medicaid Other | Admitting: Obstetrics & Gynecology

## 2015-09-02 ENCOUNTER — Encounter: Payer: Self-pay | Admitting: Obstetrics & Gynecology

## 2015-09-02 VITALS — BP 110/60 | HR 84 | Wt 179.0 lb

## 2015-09-02 DIAGNOSIS — Z1389 Encounter for screening for other disorder: Secondary | ICD-10-CM

## 2015-09-02 DIAGNOSIS — Z331 Pregnant state, incidental: Secondary | ICD-10-CM

## 2015-09-02 DIAGNOSIS — Z3493 Encounter for supervision of normal pregnancy, unspecified, third trimester: Secondary | ICD-10-CM

## 2015-09-02 DIAGNOSIS — Z3483 Encounter for supervision of other normal pregnancy, third trimester: Secondary | ICD-10-CM

## 2015-09-02 DIAGNOSIS — Z3A38 38 weeks gestation of pregnancy: Secondary | ICD-10-CM

## 2015-09-02 LAB — POCT URINALYSIS DIPSTICK
Blood, UA: NEGATIVE
Glucose, UA: NEGATIVE
Ketones, UA: NEGATIVE
Leukocytes, UA: NEGATIVE
Nitrite, UA: NEGATIVE

## 2015-09-02 NOTE — Progress Notes (Signed)
G2P1001 2273w3d Estimated Date of Delivery: 09/13/15  Blood pressure 110/60, pulse 84, weight 179 lb (81.194 kg), last menstrual period 12/07/2014.   BP weight and urine results all reviewed and noted.  Please refer to the obstetrical flow sheet for the fundal height and fetal heart rate documentation:  Patient reports good fetal movement, denies any bleeding and no rupture of membranes symptoms or regular contractions. Patient is without complaints. All questions were answered.  Orders Placed This Encounter  Procedures  . POCT urinalysis dipstick    Plan:  Continued routine obstetrical care,   Return in about 1 week (around 09/09/2015) for LROB.

## 2015-09-04 ENCOUNTER — Inpatient Hospital Stay (HOSPITAL_COMMUNITY)
Admission: AD | Admit: 2015-09-04 | Discharge: 2015-09-04 | Disposition: A | Discharge status: Home / Self Care | Payer: Medicaid Other | Source: Ambulatory Visit | Attending: Obstetrics & Gynecology | Admitting: Obstetrics & Gynecology

## 2015-09-04 ENCOUNTER — Encounter (HOSPITAL_COMMUNITY): Payer: Self-pay

## 2015-09-04 DIAGNOSIS — Z3483 Encounter for supervision of other normal pregnancy, third trimester: Secondary | ICD-10-CM | POA: Insufficient documentation

## 2015-09-04 NOTE — MAU Note (Signed)
Pt reports pain in her pelvis and pressure in her lower abd for the last 3 hours, mainly when she walks.

## 2015-09-09 ENCOUNTER — Ambulatory Visit (INDEPENDENT_AMBULATORY_CARE_PROVIDER_SITE_OTHER): Payer: Medicaid Other | Admitting: Women's Health

## 2015-09-09 ENCOUNTER — Encounter: Payer: Self-pay | Admitting: Women's Health

## 2015-09-09 VITALS — BP 100/62 | HR 96 | Wt 183.0 lb

## 2015-09-09 DIAGNOSIS — Z3A40 40 weeks gestation of pregnancy: Secondary | ICD-10-CM | POA: Diagnosis not present

## 2015-09-09 DIAGNOSIS — Z331 Pregnant state, incidental: Secondary | ICD-10-CM

## 2015-09-09 DIAGNOSIS — Z3483 Encounter for supervision of other normal pregnancy, third trimester: Secondary | ICD-10-CM

## 2015-09-09 DIAGNOSIS — O283 Abnormal ultrasonic finding on antenatal screening of mother: Secondary | ICD-10-CM | POA: Insufficient documentation

## 2015-09-09 DIAGNOSIS — O99323 Drug use complicating pregnancy, third trimester: Secondary | ICD-10-CM

## 2015-09-09 DIAGNOSIS — Z3493 Encounter for supervision of normal pregnancy, unspecified, third trimester: Secondary | ICD-10-CM

## 2015-09-09 DIAGNOSIS — Z1389 Encounter for screening for other disorder: Secondary | ICD-10-CM

## 2015-09-09 DIAGNOSIS — F1290 Cannabis use, unspecified, uncomplicated: Secondary | ICD-10-CM

## 2015-09-09 LAB — POCT URINALYSIS DIPSTICK
Glucose, UA: NEGATIVE
Ketones, UA: NEGATIVE
Leukocytes, UA: NEGATIVE
NITRITE UA: NEGATIVE

## 2015-09-09 NOTE — Progress Notes (Signed)
Low-risk OB appointment G2P1001 8575w3d Estimated Date of Delivery: 09/13/15 BP 100/62 mmHg  Pulse 96  Wt 183 lb (83.008 kg)  LMP 12/07/2014 (Exact Date)  BP, weight, and urine reviewed.  Refer to obstetrical flow sheet for FH & FHR.  Reports good fm.  Denies lof, vb, or uti s/s. UCs started this am, were q 7min, now more like q 5mins.  SVE per request: 3/50/-2, vtx, Offered membrane sweeping, discussed r/b- pt decided to proceed, so membranes swept. No more like 4/50/-2, vtx Reviewed labor s/s, fkc. Plan:  Continue routine obstetrical care  F/U in 1wk for OB appointment

## 2015-09-09 NOTE — Patient Instructions (Signed)
Call the office ([phone]) or go to [facility] if:  You begin to have strong, frequent contractions  Your water breaks.  Sometimes it is a big gush of fluid, sometimes it is just a trickle that keeps getting your panties wet or running down your legs  You have vaginal bleeding.  It is normal to have a small amount of spotting if your cervix was checked.   You don't feel your baby moving like normal.  If you don't, get you something to eat and drink and lay down and focus on feeling your baby move.  You should feel at least 10 movements in 2 hours.  If you don't, you should call the office or go to [facility].    Braxton Hicks Contractions Contractions of the uterus can occur throughout pregnancy. Contractions are not always a sign that you are in labor.  WHAT ARE BRAXTON HICKS CONTRACTIONS?  Contractions that occur before labor are called Braxton Hicks contractions, or false labor. Toward the end of pregnancy (32-34 weeks), these contractions can develop more often and may become more forceful. This is not true labor because these contractions do not result in opening (dilatation) and thinning of the cervix. They are sometimes difficult to tell apart from true labor because these contractions can be forceful and people have different pain tolerances. You should not feel embarrassed if you go to the hospital with false labor. Sometimes, the only way to tell if you are in true labor is for your health care provider to look for changes in the cervix. If there are no prenatal problems or other health problems associated with the pregnancy, it is completely safe to be sent home with false labor and await the onset of true labor. HOW CAN YOU TELL THE DIFFERENCE BETWEEN TRUE AND FALSE LABOR? False Labor  The contractions of false labor are usually shorter and not as hard as those of true labor.   The contractions are usually irregular.   The contractions are often felt in the front of  the lower abdomen and in the groin.   The contractions may go away when you walk around or change positions while lying down.   The contractions get weaker and are shorter lasting as time goes on.   The contractions do not usually become progressively stronger, regular, and closer together as with true labor.  True Labor  Contractions in true labor last 30-70 seconds, become very regular, usually become more intense, and increase in frequency.   The contractions do not go away with walking.   The discomfort is usually felt in the top of the uterus and spreads to the lower abdomen and low back.   True labor can be determined by your health care provider with an exam. This will show that the cervix is dilating and getting thinner.  WHAT TO REMEMBER  Keep up with your usual exercises and follow other instructions given by your health care provider.   Take medicines as directed by your health care provider.   Keep your regular prenatal appointments.   Eat and drink lightly if you think you are going into labor.   If Braxton Hicks contractions are making you uncomfortable:   Change your position from lying down or resting to walking, or from walking to resting.   Sit and rest in a tub of warm water.   Drink 2-3 glasses of water. Dehydration may cause these contractions.   Do slow and deep breathing several times an hour.    WHEN SHOULD I SEEK IMMEDIATE MEDICAL CARE? Seek immediate medical care if:  Your contractions become stronger, more regular, and closer together.   You have fluid leaking or gushing from your vagina.   You have a fever.   You pass blood-tinged mucus.   You have vaginal bleeding.   You have continuous abdominal pain.   You have low back pain that you never had before.   You feel your baby's head pushing down and causing pelvic pressure.   Your baby is not moving as much as it used to.    This information is not intended to  replace advice given to you by your health care provider. Make sure you discuss any questions you have with your health care provider.   Document Released: 02/08/2005 Document Revised: 02/13/2013 Document Reviewed: 11/20/2012 Elsevier Interactive Patient Education 2016 [employer]  

## 2015-09-10 LAB — PMP SCREEN PROFILE (10S), URINE
Amphetamine Screen, Ur: NEGATIVE ng/mL
BARBITURATE SCRN UR: NEGATIVE ng/mL
Benzodiazepine Screen, Urine: NEGATIVE ng/mL
COCAINE(METAB.) SCREEN, URINE: NEGATIVE ng/mL
Cannabinoids Ur Ql Scn: NEGATIVE ng/mL
Creatinine(Crt), U: 189.6 mg/dL (ref 20.0–300.0)
METHADONE SCREEN, URINE: NEGATIVE ng/mL
Opiate Scrn, Ur: NEGATIVE ng/mL
Oxycodone+Oxymorphone Ur Ql Scn: NEGATIVE ng/mL
PCP Scrn, Ur: NEGATIVE ng/mL
PH UR, DRUG SCRN: 6.9 (ref 4.5–8.9)
PROPOXYPHENE SCREEN: NEGATIVE ng/mL

## 2015-09-12 ENCOUNTER — Inpatient Hospital Stay (HOSPITAL_COMMUNITY)
Admission: AD | Admit: 2015-09-12 | Discharge: 2015-09-14 | DRG: 775 | Disposition: A | Discharge status: Home / Self Care | Payer: Medicaid Other | Source: Ambulatory Visit | Attending: Obstetrics and Gynecology | Admitting: Obstetrics and Gynecology

## 2015-09-12 ENCOUNTER — Inpatient Hospital Stay (HOSPITAL_COMMUNITY): Payer: Medicaid Other | Admitting: Anesthesiology

## 2015-09-12 ENCOUNTER — Encounter (HOSPITAL_COMMUNITY): Payer: Self-pay

## 2015-09-12 DIAGNOSIS — D649 Anemia, unspecified: Secondary | ICD-10-CM | POA: Diagnosis present

## 2015-09-12 DIAGNOSIS — O9902 Anemia complicating childbirth: Secondary | ICD-10-CM | POA: Diagnosis present

## 2015-09-12 DIAGNOSIS — Z3A39 39 weeks gestation of pregnancy: Secondary | ICD-10-CM | POA: Diagnosis not present

## 2015-09-12 DIAGNOSIS — Z823 Family history of stroke: Secondary | ICD-10-CM

## 2015-09-12 DIAGNOSIS — Z3483 Encounter for supervision of other normal pregnancy, third trimester: Secondary | ICD-10-CM | POA: Diagnosis present

## 2015-09-12 DIAGNOSIS — Z87891 Personal history of nicotine dependence: Secondary | ICD-10-CM | POA: Diagnosis not present

## 2015-09-12 DIAGNOSIS — Z8249 Family history of ischemic heart disease and other diseases of the circulatory system: Secondary | ICD-10-CM | POA: Diagnosis not present

## 2015-09-12 DIAGNOSIS — Z833 Family history of diabetes mellitus: Secondary | ICD-10-CM

## 2015-09-12 DIAGNOSIS — IMO0001 Reserved for inherently not codable concepts without codable children: Secondary | ICD-10-CM

## 2015-09-12 DIAGNOSIS — Z3493 Encounter for supervision of normal pregnancy, unspecified, third trimester: Secondary | ICD-10-CM

## 2015-09-12 LAB — CBC
HCT: 27.7 % — ABNORMAL LOW (ref 36.0–46.0)
Hemoglobin: 9.1 g/dL — ABNORMAL LOW (ref 12.0–15.0)
MCH: 28.3 pg (ref 26.0–34.0)
MCHC: 32.9 g/dL (ref 30.0–36.0)
MCV: 86 fL (ref 78.0–100.0)
PLATELETS: 249 10*3/uL (ref 150–400)
RBC: 3.22 MIL/uL — AB (ref 3.87–5.11)
RDW: 15.4 % (ref 11.5–15.5)
WBC: 10.7 10*3/uL — AB (ref 4.0–10.5)

## 2015-09-12 LAB — TYPE AND SCREEN
ABO/RH(D): O POS
ANTIBODY SCREEN: NEGATIVE

## 2015-09-12 MED ORDER — ONDANSETRON HCL 4 MG/2ML IJ SOLN: 4.0000 mg | Freq: Four times a day (QID) | INTRAMUSCULAR | Status: DC | PRN

## 2015-09-12 MED ORDER — OXYCODONE-ACETAMINOPHEN 5-325 MG PO TABS: 2.0000 | ORAL_TABLET | ORAL | Status: DC | PRN

## 2015-09-12 MED ORDER — LACTATED RINGERS IV SOLN: 500.0000 mL | INTRAVENOUS | Status: DC | PRN

## 2015-09-12 MED ORDER — DIPHENHYDRAMINE HCL 50 MG/ML IJ SOLN: 12.5000 mg | INTRAMUSCULAR | Status: DC | PRN

## 2015-09-12 MED ORDER — OXYCODONE-ACETAMINOPHEN 5-325 MG PO TABS: 1.0000 | ORAL_TABLET | ORAL | Status: DC | PRN

## 2015-09-12 MED ORDER — LACTATED RINGERS IV SOLN
INTRAVENOUS | Status: DC
  Administered 2015-09-13: 02:00:00 via INTRAVENOUS

## 2015-09-12 MED ORDER — LACTATED RINGERS IV SOLN
500.0000 mL | Freq: Once | INTRAVENOUS | Status: AC
  Administered 2015-09-12: 500 mL via INTRAVENOUS

## 2015-09-12 MED ORDER — EPHEDRINE 5 MG/ML INJ
10.0000 mg | INTRAVENOUS | Status: DC | PRN
  Filled 2015-09-12: qty 2

## 2015-09-12 MED ORDER — PHENYLEPHRINE 40 MCG/ML (10ML) SYRINGE FOR IV PUSH (FOR BLOOD PRESSURE SUPPORT)
80.0000 ug | PREFILLED_SYRINGE | INTRAVENOUS | Status: DC | PRN
  Filled 2015-09-12: qty 5
  Filled 2015-09-12: qty 10

## 2015-09-12 MED ORDER — LIDOCAINE HCL (PF) 1 % IJ SOLN
INTRAMUSCULAR | Status: DC | PRN
  Administered 2015-09-12: 6 mL via EPIDURAL

## 2015-09-12 MED ORDER — LIDOCAINE HCL (PF) 1 % IJ SOLN
30.0000 mL | INTRAMUSCULAR | Status: DC | PRN
  Filled 2015-09-12: qty 30

## 2015-09-12 MED ORDER — ACETAMINOPHEN 325 MG PO TABS: 650.0000 mg | ORAL_TABLET | ORAL | Status: DC | PRN

## 2015-09-12 MED ORDER — FENTANYL 2.5 MCG/ML BUPIVACAINE 1/10 % EPIDURAL INFUSION (WH - ANES)
14.0000 mL/h | INTRAMUSCULAR | Status: DC | PRN
Start: 2015-09-12 — End: 2015-09-13
  Administered 2015-09-12 – 2015-09-13 (×2): 14 mL/h via EPIDURAL
  Filled 2015-09-12 (×2): qty 125

## 2015-09-12 MED ORDER — OXYTOCIN BOLUS FROM INFUSION
500.0000 mL | INTRAVENOUS | Status: DC
  Administered 2015-09-13: 500 mL via INTRAVENOUS

## 2015-09-12 MED ORDER — PHENYLEPHRINE 40 MCG/ML (10ML) SYRINGE FOR IV PUSH (FOR BLOOD PRESSURE SUPPORT)
80.0000 ug | PREFILLED_SYRINGE | INTRAVENOUS | Status: DC | PRN
  Filled 2015-09-12: qty 5

## 2015-09-12 MED ORDER — SOD CITRATE-CITRIC ACID 500-334 MG/5ML PO SOLN: 30.0000 mL | ORAL | Status: DC | PRN

## 2015-09-12 MED ORDER — OXYTOCIN 40 UNITS IN LACTATED RINGERS INFUSION - SIMPLE MED
2.5000 [IU]/h | INTRAVENOUS | Status: DC
  Administered 2015-09-13: 2.5 [IU]/h via INTRAVENOUS
  Filled 2015-09-12: qty 1000

## 2015-09-12 NOTE — MAU Note (Signed)
Contractions all day but closer since 1530. Some bloody show. 4cm last sve

## 2015-09-12 NOTE — H&P (Signed)
LABOR AND DELIVERY ADMISSION HISTORY AND PHYSICAL NOTE  Cassandra Berg is a 25 y.o. female G2P1001 with IUP at [redacted]w[redacted]d by LMP and 7 wk [place] presenting for active labor.   She reports positive fetal movement. She denies leakage of fluid or vaginal bleeding.  Prenatal History/Complications:  Past Medical History: Past Medical History  Diagnosis Date  . Anemia     Past Surgical History: Past Surgical History  Procedure Laterality Date  . Wisdom tooth extraction      Obstetrical History: OB History    Gravida Para Term Preterm AB TAB SAB Ectopic Multiple Living   Social History: Social History   Social History  . Marital Status: Single    Spouse Name: N/A  . Number of Children: N/A  . Years of Education: N/A   Social History Main Topics  . Smoking status: Former Smoker -- 0.25 packs/day    Types: Cigarettes  . Smokeless tobacco: Never Used  . Alcohol Use: No     Comment: socially  . Drug Use: No  . Sexual Activity: Yes    Birth Control/ Protection: None   Other Topics Concern  . None   Social History Narrative    Family History: Family History  Problem Relation Age of Onset  . Heart disease Mother   . Stroke Mother   . Hypertension Mother   . Diabetes Father   . Cancer Maternal Grandmother     lung  . Diabetes Paternal Grandmother     Allergies: No Known Allergies  Prescriptions prior to admission  Medication Sig Dispense Refill Last Dose  . acetaminophen (TYLENOL) 500 MG tablet Take 500 mg by mouth every 6 (six) hours as needed for mild pain. Reported on 07/17/2015   Past Week at Unknown time  . calcium carbonate (TUMS - DOSED IN MG ELEMENTAL CALCIUM) 500 MG chewable tablet Chew 2 tablets by mouth at bedtime as needed for indigestion or heartburn.   Past Week at Unknown time  . ferrous sulfate 325 (65 FE) MG tablet Take 325 mg by mouth daily with breakfast.   09/12/2015 at Unknown time  . Prenat w/o A-FeCbGl-DSS-FA-DHA (CITRANATAL  ASSURE) 35-1 & 300 MG tablet One tablet and one capsule daily (Patient taking differently: Take 1 tablet by mouth daily. One tablet and one capsule daily) 60 tablet 11 Past Week at Unknown time     Review of Systems   All systems reviewed and negative except as stated in HPI  Blood pressure 132/72, pulse 96, temperature 98.2 F (36.8 C), resp. rate 18, height  (1.676 m), weight 84.097 kg (185 lb 6.4 oz), last menstrual period 12/07/2014. General appearance: alert, cooperative and no distress Lungs: clear to auscultation bilaterally Heart: regular rate and rhythm Abdomen: soft, non-tender; bowel sounds normal Extremities: No calf swelling or tenderness Presentation: cephalic Fetal monitoring: cat 1 Uterine activity: ctx q3-5  Dilation: 5.5 Effacement (%): 100 Station: -2 Exam by:: Sharen Hint RNC   Prenatal labs: ABO, Rh: O/Positive/-- (01/04 1038) Antibody: Negative (05/05 0903) Rubella: !Error! RPR: Non Reactive (05/05 0903)  HBsAg: Negative (01/04 1038)  HIV: Non Reactive (05/05 0903)  GBS: Negative (06/29 1430)  1 hr Glucola: 2 hr normal Genetic screening:  declined Anatomy US: Female, Lt EICF, otherwise normal, Repeat @ 28wks d/t no genetic screening: stable  Prenatal Transfer Tool  Maternal Diabetes: No Genetic Screening: Declined Maternal Ultrasounds/Referrals: Abnormal:  Findings:  Isolated EIF (echogenic intracardiac focus) Fetal Ultrasounds or other Referrals:  None Maternal Substance Abuse:  Yes:  Type: Marijuana Significant Maternal Medications:  None Significant Maternal Lab Results: None  No results found for this or any previous visit (from the past 24 hour(s)).  Patient Active Problem List   Diagnosis Date Noted  . Fetal echogenic intracardiac focus on prenatal ultrasound 09/09/2015  . 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 Supervision of normal pregnancy 02/26/2015  . History of shoulder dystocia in prior pregnancy, currently pregnant 02/26/2015     Assessment: Cassandra Berg is a 25 y.o. G2P1001 at 8074w6d here for spontaneous onset of labor.   #Labor: expectant management #Pain: Desires epidural #FWB: Cat 1 #ID:  GBS neg #MOF: bottle #MOC:depo #Circ:  Outpatient #anemia: cbc pending #hx shoulder dystocia: noted. efw wnl @ 36 wks  Loni MuseKate Timberlake 09/12/2015, 9:04 PM    OB FELLOW HISTORY AND PHYSICAL ATTESTATION  I have seen and examined this patient; I agree with above documentation in the resident's note.    Cherrie Gauzeoah B Azul Coffie 09/13/2015, 3:31 AM

## 2015-09-12 NOTE — Anesthesia Procedure Notes (Signed)

## 2015-09-12 NOTE — Anesthesia Preprocedure Evaluation (Signed)
Anesthesia Evaluation  Patient identified by MRN, date of birth, ID band Patient awake    Reviewed: Allergy & Precautions, H&P , Patient's Chart, lab work & pertinent test results  Airway Mallampati: II  TM Distance: >3 FB Neck ROM: full    Dental  (+) Teeth Intact   Pulmonary former smoker,  breath sounds clear to auscultation        Cardiovascular Rhythm:regular Rate:Normal     Neuro/Psych    GI/Hepatic   Endo/Other    Renal/GU      Musculoskeletal   Abdominal   Peds  Hematology  (+) anemia ,   Anesthesia Other Findings       Reproductive/Obstetrics (+) Pregnancy                             Anesthesia Physical Anesthesia Plan  ASA: II  Anesthesia Plan: Epidural   Post-op Pain Management:    Induction:   Airway Management Planned:   Additional Equipment:   Intra-op Plan:   Post-operative Plan:   Informed Consent: I have reviewed the patients History and Physical, chart, labs and discussed the procedure including the risks, benefits and alternatives for the proposed anesthesia with the patient or authorized representative who has indicated his/her understanding and acceptance.   Dental Advisory Given  Plan Discussed with:   Anesthesia Plan Comments: (Labs checked- platelets confirmed with RN in room. Fetal heart tracing, per RN, reported to be stable enough for sitting procedure. Discussed epidural, and patient consents to the procedure:  included risk of possible headache,backache, failed block, allergic reaction, and nerve injury. This patient was asked if she had any questions or concerns before the procedure started.)        Anesthesia Quick Evaluation  

## 2015-09-13 ENCOUNTER — Encounter (HOSPITAL_COMMUNITY): Payer: Self-pay | Admitting: *Deleted

## 2015-09-13 DIAGNOSIS — D649 Anemia, unspecified: Secondary | ICD-10-CM

## 2015-09-13 DIAGNOSIS — Z3A39 39 weeks gestation of pregnancy: Secondary | ICD-10-CM

## 2015-09-13 DIAGNOSIS — O9902 Anemia complicating childbirth: Secondary | ICD-10-CM

## 2015-09-13 LAB — RPR: RPR Ser Ql: NONREACTIVE

## 2015-09-13 LAB — ABO/RH: ABO/RH(D): O POS

## 2015-09-13 MED ORDER — SIMETHICONE 80 MG PO CHEW: 80.0000 mg | CHEWABLE_TABLET | ORAL | Status: DC | PRN

## 2015-09-13 MED ORDER — BENZOCAINE-MENTHOL 20-0.5 % EX AERO
1.0000 "application " | INHALATION_SPRAY | CUTANEOUS | Status: DC | PRN
  Administered 2015-09-13: 1 via TOPICAL
  Filled 2015-09-13: qty 56

## 2015-09-13 MED ORDER — WITCH HAZEL-GLYCERIN EX PADS: 1.0000 "application " | MEDICATED_PAD | CUTANEOUS | Status: DC | PRN

## 2015-09-13 MED ORDER — MEASLES, MUMPS & RUBELLA VAC ~~LOC~~ INJ
0.5000 mL | INJECTION | Freq: Once | SUBCUTANEOUS | Status: DC
  Filled 2015-09-13: qty 0.5

## 2015-09-13 MED ORDER — IBUPROFEN 600 MG PO TABS
600.0000 mg | ORAL_TABLET | Freq: Four times a day (QID) | ORAL | Status: DC
  Administered 2015-09-13 – 2015-09-14 (×5): 600 mg via ORAL
  Filled 2015-09-13 (×5): qty 1

## 2015-09-13 MED ORDER — ONDANSETRON HCL 4 MG/2ML IJ SOLN: 4.0000 mg | INTRAMUSCULAR | Status: DC | PRN

## 2015-09-13 MED ORDER — ONDANSETRON HCL 4 MG PO TABS: 4.0000 mg | ORAL_TABLET | ORAL | Status: DC | PRN

## 2015-09-13 MED ORDER — COCONUT OIL OIL: 1.0000 "application " | TOPICAL_OIL | Status: DC | PRN

## 2015-09-13 MED ORDER — PRENATAL MULTIVITAMIN CH
1.0000 | ORAL_TABLET | Freq: Every day | ORAL | Status: DC
  Administered 2015-09-13 – 2015-09-14 (×2): 1 via ORAL
  Filled 2015-09-13 (×2): qty 1

## 2015-09-13 MED ORDER — DIBUCAINE 1 % RE OINT: 1.0000 "application " | TOPICAL_OINTMENT | RECTAL | Status: DC | PRN

## 2015-09-13 MED ORDER — DIPHENHYDRAMINE HCL 25 MG PO CAPS: 25.0000 mg | ORAL_CAPSULE | Freq: Four times a day (QID) | ORAL | Status: DC | PRN

## 2015-09-13 MED ORDER — ZOLPIDEM TARTRATE 5 MG PO TABS: 5.0000 mg | ORAL_TABLET | Freq: Every evening | ORAL | Status: DC | PRN

## 2015-09-13 MED ORDER — TETANUS-DIPHTH-ACELL PERTUSSIS 5-2.5-18.5 LF-MCG/0.5 IM SUSP
0.5000 mL | Freq: Once | INTRAMUSCULAR | Status: AC
  Administered 2015-09-14: 0.5 mL via INTRAMUSCULAR
  Filled 2015-09-13: qty 0.5

## 2015-09-13 MED ORDER — ACETAMINOPHEN 325 MG PO TABS
650.0000 mg | ORAL_TABLET | ORAL | Status: DC | PRN
  Administered 2015-09-13: 650 mg via ORAL
  Filled 2015-09-13: qty 2

## 2015-09-13 MED ORDER — SENNOSIDES-DOCUSATE SODIUM 8.6-50 MG PO TABS
2.0000 | ORAL_TABLET | ORAL | Status: DC
  Administered 2015-09-13: 2 via ORAL
  Filled 2015-09-13: qty 2

## 2015-09-13 NOTE — Anesthesia Postprocedure Evaluation (Signed)
Anesthesia Post Note  Patient: Cassandra Berg  Procedure(s) Performed: * No procedures listed *  Patient location during evaluation: Mother Baby Anesthesia Type: Epidural Level of consciousness: awake and alert Pain management: pain level controlled Vital Signs Assessment: post-procedure vital signs reviewed and stable Respiratory status: spontaneous breathing Cardiovascular status: stable Postop Assessment: no headache, adequate PO intake, no backache, patient able to bend at knees, epidural receding and no signs of nausea or vomiting Anesthetic complications: no     Last Vitals:  Filed Vitals:   09/13/15 1005 09/13/15 1411  BP: 114/63 105/65  Pulse: 95 87  Temp: 37 C 36.8 C  Resp: 20 20    Last Pain:  Filed Vitals:   09/13/15 1449  PainSc: 0-No pain   Pain Goal: Patients Stated Pain Goal: 0 (09/12/15 2124)               Salome Arnt

## 2015-09-14 DIAGNOSIS — Z3A39 39 weeks gestation of pregnancy: Secondary | ICD-10-CM

## 2015-09-14 DIAGNOSIS — Z87891 Personal history of nicotine dependence: Secondary | ICD-10-CM

## 2015-09-14 DIAGNOSIS — O9902 Anemia complicating childbirth: Secondary | ICD-10-CM

## 2015-09-14 MED ORDER — IBUPROFEN 600 MG PO TABS
600.0000 mg | ORAL_TABLET | Freq: Four times a day (QID) | ORAL | 0 refills | Status: DC
Start: 2015-09-14 — End: 2016-08-31

## 2015-09-14 NOTE — Clinical Social Work Maternal (Signed)
CLINICAL SOCIAL WORK MATERNAL/CHILD NOTE  Patient Details  Name: Cassandra Berg MRN: [id number] Date of Birth: 01/26/1991  Date:  09/14/2015  Clinical Social Worker Initiating Note:   (Korrine Sicard lcsw) Date/ Time Initiated:  09/14/15/      Child's Name:      Legal Guardian:  Mother   Need for Interpreter:  None   Date of Referral:  09/13/15     Reason for Referral:  Current Substance Use/Substance Use During Pregnancy    Referral Source:  Physician   Address:   ([address] [place] [address])  Phone number:   [phone])   Household Members:  Self, Minor Children, Domestic Acupuncturist (not living in the home):  Extended Family   Professional Supports: None   Employment:   Currently not working, but hopes to get back to work ASAP, as she "misses it."  Type of Work:   Unknown  Education:  Associate Professor Resources:  Medicaid   Other Resources:  [facility]   Cultural/Religious Considerations Which May Impact Care:  None noted. Strengths:  Ability to meet basic needs , Compliance with medical plan , Home prepared for child    Risk Factors/Current Problems:  Substance Use    Cognitive State:  Alert , Able to Concentrate    Mood/Affect:  Calm , Comfortable    CSW Assessment:  CSW met with pt to discuss her prior THC use/need to drug screen baby.  Pt admits to using marijuana "early on" in her pregnancy, but has abstained since then.  Pt understands the need to drug screen her son and was cooperative with plan of care.   Per pt, the FOB is supportive and she also has a strong support system amongst her family/friends.  Pt denies any hx of MI, but PPD was discussed.  Mom agreeable to f/u with her MD re: any changes in mood/behavior, post d/c.  Mom reports that her home is prepared for baby and has all the necessary supplies, clothing, and equipment to care for baby.  No other social work needs identified.  Baby's UDS  negative, CSW will continue to follow for cord blood testing results and f/u accordingly.  Emotional support provided to pt/family.  CSW Plan/Description:  Psychosocial Support and Ongoing Assessment of Needs    Linna Caprice, LCSW 09/14/2015, 12:36 PM

## 2015-09-14 NOTE — Discharge Summary (Signed)
OB Discharge Summary  Patient Name: Cassandra Berg DOB: [birth year] MRN: [id number]  Date of admission: 09/12/2015 Delivering MD: Shonna Chock [place]   Date of discharge: 09/14/2015  Admitting diagnosis: 39w etremely painful ctx, 7 min apart Intrauterine pregnancy: [redacted]w[redacted]d     Secondary diagnosis:Active Problems:   Active labor  Additional problems:none     Discharge diagnosis: Term Pregnancy Delivered                                                                     Post partum procedures:none  Augmentation: none  Complications: None  Hospital course:  Onset of Labor With Vaginal Delivery     25 y.o. yo [id number] at [redacted]w[redacted]d was admitted in Active Labor on 09/12/2015. Patient had an uncomplicated labor course as follows:  Membrane Rupture Time/Date: 10:15 PM ,09/12/2015   Intrapartum Procedures: Episiotomy: None [1]                                         Lacerations:  None [1]  Patient had a delivery of a Viable infant. 09/13/2015  Information for the patient's newborn:  Dametra, Heffern [[id number]]  Delivery Method: Vag-Spont    Pateint had an uncomplicated postpartum course.  She is ambulating, tolerating a regular diet, passing flatus, and urinating well. Patient is discharged home in stable condition on 09/14/15.    Physical exam Vitals:   09/13/15 1005 09/13/15 1411 09/13/15 1737 09/14/15 0520  BP: 114/63 105/65 105/62 (!) 101/59  Pulse: 95 87 80 69  Resp: 20 20 18 18   Temp: 98.6 F (37 C) 98.3 F (36.8 C) 98 F (36.7 C) 97.7 F (36.5 C)  TempSrc: Oral Oral Oral   SpO2:   100%   Weight:      Height:       General: alert, cooperative and no distress Lochia: appropriate Uterine Fundus: firm Incision: N/A DVT Evaluation: No evidence of DVT seen on physical exam. Labs: Lab Results  Component Value Date   WBC 10.7 (H) 09/12/2015   HGB 9.1 (L) 09/12/2015   HCT 27.7 (L) 09/12/2015   MCV 86.0 09/12/2015   PLT 249 09/12/2015   CMP Latest Ref Rng &  Units 09/13/2013  Glucose 70 - 99 mg/dL 89  BUN 6 - 23 mg/dL 12  Creatinine 0.21 - 1.15 mg/dL 5.20  Sodium 802 - 233 mEq/L 141  Potassium 3.7 - 5.3 mEq/L 4.5  Chloride 96 - 112 mEq/L 104  CO2 19 - 32 mEq/L 27  Calcium 8.4 - 10.5 mg/dL 9.6  Total Protein - -  Total Bilirubin - -  Alkaline Phos - -  AST - -  ALT - -    Discharge instruction: per After Visit Summary and "Baby and Me Booklet".  After Visit Meds:    Medication List    STOP taking these medications   acetaminophen 500 MG tablet Commonly known as:  TYLENOL     TAKE these medications   CITRANATAL ASSURE 35-1 & 300 MG tablet One tablet and one capsule daily What changed:  how much to take  how to take this  when  to take this  additional instructions   ferrous sulfate 325 (65 FE) MG tablet Take 325 mg by mouth daily with breakfast.   ibuprofen 600 MG tablet Commonly known as:  ADVIL,MOTRIN Take 1 tablet (600 mg total) by mouth every 6 (six) hours.     ASK your doctor about these medications   calcium carbonate 500 MG chewable tablet Commonly known as:  TUMS - dosed in mg elemental calcium Chew 2 tablets by mouth at bedtime as needed for indigestion or heartburn.       Diet: routine diet  Activity: Advance as tolerated. Pelvic rest for 6 weeks.   Outpatient follow up:6 weeks Follow up Appt:Future Appointments Date Time Provider Department Center  09/16/2015 3:15 PM Lazaro Arms, MD [facility]   Follow up visit: No Follow-up on file.  Postpartum contraception: Depo Provera  Newborn Data: Live born female  Birth Weight: 9 lb 2.6 oz (4155 g) APGAR: 5, 9  Baby Feeding: Bottle and Breast Disposition:home with mother   09/14/2015 Wyvonnia Dusky, CNM

## 2015-09-14 NOTE — Discharge Summary (Signed)
OB Discharge Summary     Patient Name: Cassandra Berg DOB: Mar 10, 1990 MRN: [id number]  Date of admission: 09/12/2015 Delivering MD: Shonna Chock [place]   Date of discharge: 09/14/2015  Admitting diagnosis: 39w etremely painful ctx, 7 min apart Intrauterine pregnancy: [redacted]w[redacted]d     Secondary diagnosis:  Active Problems:   Active labor  Additional problems: anemia     Discharge diagnosis: Term Pregnancy Delivered                                                                                                Post partum procedures:none  Augmentation: none  Complications: None  Hospital course:  Onset of Labor With Vaginal Delivery     25 y.o. yo [id number] at [redacted]w[redacted]d was admitted in Active Labor on 09/12/2015. Patient had an uncomplicated labor course as follows:  Membrane Rupture Time/Date: 10:15 PM ,09/12/2015   Intrapartum Procedures: Episiotomy: None [1]                                         Lacerations:  None [1]  Patient had a delivery of a Viable infant. 09/13/2015  Information for the patient's newborn:  Donneisha, Beane [[id number]]  Delivery Method: Vag-Spont    Pateint had an uncomplicated postpartum course.  She is ambulating, tolerating a regular diet, passing flatus, and urinating well. Patient is discharged home in stable condition on 09/14/15.    Physical exam Vitals:   09/13/15 1005 09/13/15 1411 09/13/15 1737 09/14/15 0520  BP: 114/63 105/65 105/62 (!) 101/59  Pulse: 95 87 80 69  Resp: Temp: 98.6 F (37 C) 98.3 F (36.8 C) 98 F (36.7 C) 97.7 F (36.5 C)  TempSrc: Oral Oral Oral   SpO2:   100%   Weight:      Height:       General: alert, cooperative and no distress Lochia: appropriate Uterine Fundus: firm Incision: N/A DVT Evaluation: No evidence of DVT seen on physical exam. No significant calf/ankle edema. Labs: Lab Results  Component Value Date   WBC 10.7 (H) 09/12/2015   HGB 9.1 (L) 09/12/2015   HCT 27.7 (L) 09/12/2015   MCV 86.0 09/12/2015   PLT 249 09/12/2015   CMP Latest Ref Rng & Units 09/13/2013  Glucose 70 - 99 mg/dL 89  BUN 6 - 23 mg/dL 12  Creatinine 2.95 - 2.84 mg/dL 1.32  Sodium 440 - 102 mEq/L 141  Potassium 3.7 - 5.3 mEq/L 4.5  Chloride 96 - 112 mEq/L 104  CO2 19 - 32 mEq/L 27  Calcium 8.4 - 10.5 mg/dL 9.6  Total Protein - -  Total Bilirubin - -  Alkaline Phos - -  AST - -  ALT - -    Discharge instruction: per After Visit Summary and "Baby and Me Booklet".  After visit meds:    Medication List    STOP taking these medications   acetaminophen 500 MG tablet Commonly known as:  TYLENOL   calcium carbonate  500 MG chewable tablet Commonly known as:  TUMS - dosed in mg elemental calcium     TAKE these medications   CITRANATAL ASSURE 35-1 & 300 MG tablet One tablet and one capsule daily What changed:  how much to take  how to take this  when to take this  additional instructions   ferrous sulfate 325 (65 FE) MG tablet Take 325 mg by mouth daily with breakfast.   ibuprofen 600 MG tablet Commonly known as:  ADVIL,MOTRIN Take 1 tablet (600 mg total) by mouth every 6 (six) hours.       Diet: routine diet  Activity: Advance as tolerated. Pelvic rest for 6 weeks.   Outpatient follow up:6 weeks Follow up Appt:Future Appointments Date Time Provider Department Center  09/16/2015 3:15 PM Lazaro Arms, MD [facility]   Follow up Visit:No Follow-up on file.  Postpartum contraception: IUD Mirena  Newborn Data: Live born female  Birth Weight: 9 lb 2.6 oz (4155 g) APGAR: 5, 9  Baby Feeding: Bottle Disposition:home with mother   09/14/2015 Loni Muse, MD

## 2015-09-14 NOTE — Discharge Instructions (Signed)
Vaginal Delivery, Care After °Refer to this sheet in the next few weeks. These discharge instructions provide you with information on caring for yourself after delivery. Your health care provider may also give you specific instructions. Your treatment has been planned according to the most current medical practices available, but problems sometimes occur. Call your health care provider if you have any problems or questions after you go home. °HOME CARE INSTRUCTIONS °· Take over-the-counter or prescription medicines only as directed by your health care provider or pharmacist. °· Do not drink alcohol, especially if you are breastfeeding or taking medicine to relieve pain. °· Do not chew or smoke tobacco. °· Do not use illegal drugs. °· Continue to use good perineal care. Good perineal care includes: °· Wiping your perineum from front to back. °· Keeping your perineum clean. °· Do not use tampons or douche until your health care provider says it is okay. °· Shower, wash your hair, and take tub baths as directed by your health care provider. °· Wear a well-fitting bra that provides breast support. °· Eat healthy foods. °· Drink enough fluids to keep your urine clear or pale yellow. °· Eat high-fiber foods such as whole grain cereals and breads, brown rice, beans, and fresh fruits and vegetables every day. These foods may help prevent or relieve constipation. °· Follow your health care provider's recommendations regarding resumption of activities such as climbing stairs, driving, lifting, exercising, or traveling. °· Talk to your health care provider about resuming sexual activities. Resumption of sexual activities is dependent upon your risk of infection, your rate of healing, and your comfort and desire to resume sexual activity. °· Try to have someone help you with your household activities and your newborn for at least a few days after you leave the hospital. °· Rest as much as possible. Try to rest or take a nap  when your newborn is sleeping. °· Increase your activities gradually. °· Keep all of your scheduled postpartum appointments. It is very important to keep your scheduled follow-up appointments. At these appointments, your health care provider will be checking to make sure that you are healing physically and emotionally. °SEEK MEDICAL CARE IF:  °· You are passing large clots from your vagina. Save any clots to show your health care provider. °· You have a foul smelling discharge from your vagina. °· You have trouble urinating. °· You are urinating frequently. °· You have pain when you urinate. °· You have a change in your bowel movements. °· You have increasing redness, pain, or swelling near your vaginal incision (episiotomy) or vaginal tear. °· You have pus draining from your episiotomy or vaginal tear. °· Your episiotomy or vaginal tear is separating. °· You have painful, hard, or reddened breasts. °· You have a severe headache. °· You have blurred vision or see spots. °· You feel sad or depressed. °· You have thoughts of hurting yourself or your newborn. °· You have questions about your care, the care of your newborn, or medicines. °· You are dizzy or light-headed. °· You have a rash. °· You have nausea or vomiting. °· You were breastfeeding and have not had a menstrual period within 12 weeks after you stopped breastfeeding. °· You are not breastfeeding and have not had a menstrual period by the 12th week after delivery. °· You have a fever. °SEEK IMMEDIATE MEDICAL CARE IF:  °· You have persistent pain. °· You have chest pain. °· You have shortness of breath. °· You faint. °· You   have leg pain. °· You have stomach pain. °· Your vaginal bleeding saturates two or more sanitary pads in 1 hour. °  °This information is not intended to replace advice given to you by your health care provider. Make sure you discuss any questions you have with your health care provider. °  °Document Released: 02/06/2000 Document Revised:  10/30/2014 Document Reviewed: 10/06/2011 °Elsevier Interactive Patient Education ©2016 [employer] ° °Breastfeeding °Deciding to breastfeed is one of the best choices you can make for you and your baby. A change in hormones during pregnancy causes your breast tissue to grow and increases the number and size of your milk ducts. These hormones also allow proteins, sugars, and fats from your blood supply to make breast milk in your milk-producing glands. Hormones prevent breast milk from being released before your baby is born as well as prompt milk flow after birth. Once breastfeeding has begun, thoughts of your baby, as well as his or her sucking or crying, can stimulate the release of milk from your milk-producing glands.  °BENEFITS OF BREASTFEEDING °For Your Baby °· Your first milk (colostrum) helps your baby's digestive system function better. °· There are antibodies in your milk that help your baby fight off infections. °· Your baby has a lower incidence of asthma, allergies, and sudden infant death syndrome. °· The nutrients in breast milk are better for your baby than infant formulas and are designed uniquely for your baby's needs. °· Breast milk improves your baby's brain development. °· Your baby is less likely to develop other conditions, such as childhood obesity, asthma, or type 2 diabetes mellitus. °For You °· Breastfeeding helps to create a very special bond between you and your baby. °· Breastfeeding is convenient. Breast milk is always available at the correct temperature and costs nothing. °· Breastfeeding helps to burn calories and helps you lose the weight gained during pregnancy. °· Breastfeeding makes your uterus contract to its prepregnancy size faster and slows bleeding (lochia) after you give birth.   °· Breastfeeding helps to lower your risk of developing type 2 diabetes mellitus, osteoporosis, and breast or ovarian cancer later in life. °SIGNS THAT YOUR BABY IS HUNGRY °Early Signs of  Hunger °· Increased alertness or activity. °· Stretching. °· Movement of the head from side to side. °· Movement of the head and opening of the mouth when the corner of the mouth or cheek is stroked (rooting). °· Increased sucking sounds, smacking lips, cooing, sighing, or squeaking. °· Hand-to-mouth movements. °· Increased sucking of fingers or hands. °Late Signs of Hunger °· Fussing. °· Intermittent crying. °Extreme Signs of Hunger °Signs of extreme hunger will require calming and consoling before your baby will be able to breastfeed successfully. Do not wait for the following signs of extreme hunger to occur before you initiate breastfeeding: °· Restlessness. °· A loud, strong cry. °· Screaming. °BREASTFEEDING BASICS °Breastfeeding Initiation °· Find a comfortable place to sit or lie down, with your neck and back well supported. °· Place a pillow or rolled up blanket under your baby to bring him or her to the level of your breast (if you are seated). Nursing pillows are specially designed to help support your arms and your baby while you breastfeed. °· Make sure that your baby's abdomen is facing your abdomen. °· Gently massage your breast. With your fingertips, massage from your chest wall toward your nipple in a circular motion. This encourages milk flow. You may need to continue this action during the feeding if your   milk flows slowly. °· Support your breast with 4 fingers underneath and your thumb above your nipple. Make sure your fingers are well away from your nipple and your baby's mouth. °· Stroke your baby's lips gently with your finger or nipple. °· When your baby's mouth is open wide enough, quickly bring your baby to your breast, placing your entire nipple and as much of the colored area around your nipple (areola) as possible into your baby's mouth. °¨ More areola should be visible above your baby's upper lip than below the lower lip. °¨ Your baby's tongue should be between his or her lower gum and  your breast. °· Ensure that your baby's mouth is correctly positioned around your nipple (latched). Your baby's lips should create a seal on your breast and be turned out (everted). °· It is common for your baby to suck about 2-3 minutes in order to start the flow of breast milk. °Latching °Teaching your baby how to latch on to your breast properly is very important. An improper latch can cause nipple pain and decreased milk supply for you and poor weight gain in your baby. Also, if your baby is not latched onto your nipple properly, he or she may swallow some air during feeding. This can make your baby fussy. Burping your baby when you switch breasts during the feeding can help to get rid of the air. However, teaching your baby to latch on properly is still the best way to prevent fussiness from swallowing air while breastfeeding. °Signs that your baby has successfully latched on to your nipple: °· Silent tugging or silent sucking, without causing you pain. °· Swallowing heard between every 3-4 sucks. °· Muscle movement above and in front of his or her ears while sucking. °Signs that your baby has not successfully latched on to nipple: °· Sucking sounds or smacking sounds from your baby while breastfeeding. °· Nipple pain. °If you think your baby has not latched on correctly, slip your finger into the corner of your baby's mouth to break the suction and place it between your baby's gums. Attempt breastfeeding initiation again. °Signs of Successful Breastfeeding °Signs from your baby: °· A gradual decrease in the number of sucks or complete cessation of sucking. °· Falling asleep. °· Relaxation of his or her body. °· Retention of a small amount of milk in his or her mouth. °· Letting go of your breast by himself or herself. °Signs from you: °· Breasts that have increased in firmness, weight, and size 1-3 hours after feeding. °· Breasts that are softer immediately after breastfeeding. °· Increased milk volume, as  well as a change in milk consistency and color by the fifth day of breastfeeding. °· Nipples that are not sore, cracked, or bleeding. °Signs That Your Baby is Getting Enough Milk °· Wetting at least 3 diapers in a 24-hour period. The urine should be clear and pale yellow by age 5 days. °· At least 3 stools in a 24-hour period by age 5 days. The stool should be soft and yellow. °· At least 3 stools in a 24-hour period by age 7 days. The stool should be seedy and yellow. °· No loss of weight greater than 10% of birth weight during the first 3 days of age. °· Average weight gain of 4-7 ounces (113-198 g) per week after age 4 days. °· Consistent daily weight gain by age 5 days, without weight loss after the age of 2 weeks. °After a feeding, your baby may spit   up a small amount. This is common. °BREASTFEEDING FREQUENCY AND DURATION °Frequent feeding will help you make more milk and can prevent sore nipples and breast engorgement. Breastfeed when you feel the need to reduce the fullness of your breasts or when your baby shows signs of hunger. This is called "breastfeeding on demand." Avoid introducing a pacifier to your baby while you are working to establish breastfeeding (the first 4-6 weeks after your baby is born). After this time you may choose to use a pacifier. Research has shown that pacifier use during the first year of a baby's life decreases the risk of sudden infant death syndrome (SIDS). °Allow your baby to feed on each breast as long as he or she wants. Breastfeed until your baby is finished feeding. When your baby unlatches or falls asleep while feeding from the first breast, offer the second breast. Because newborns are often sleepy in the first few weeks of life, you may need to awaken your baby to get him or her to feed. °Breastfeeding times will vary from baby to baby. However, the following rules can serve as a guide to help you ensure that your baby is properly fed: °· Newborns (babies 4 weeks of age  or younger) may breastfeed every 1-3 hours. °· Newborns should not go longer than 3 hours during the day or 5 hours during the night without breastfeeding. °· You should breastfeed your baby a minimum of 8 times in a 24-hour period until you begin to introduce solid foods to your baby at around 6 months of age. °BREAST MILK PUMPING °Pumping and storing breast milk allows you to ensure that your baby is exclusively fed your breast milk, even at times when you are unable to breastfeed. This is especially important if you are going back to work while you are still breastfeeding or when you are not able to be present during feedings. Your lactation consultant can give you guidelines on how long it is safe to store breast milk. °A breast pump is a machine that allows you to pump milk from your breast into a sterile bottle. The pumped breast milk can then be stored in a refrigerator or freezer. Some breast pumps are operated by hand, while others use electricity. Ask your lactation consultant which type will work best for you. Breast pumps can be purchased, but some hospitals and breastfeeding support groups lease breast pumps on a monthly basis. A lactation consultant can teach you how to hand express breast milk, if you prefer not to use a pump. °CARING FOR YOUR BREASTS WHILE YOU BREASTFEED °Nipples can become dry, cracked, and sore while breastfeeding. The following recommendations can help keep your breasts moisturized and healthy: °· Avoid using soap on your nipples. °· Wear a supportive bra. Although not required, special nursing bras and tank tops are designed to allow access to your breasts for breastfeeding without taking off your entire bra or top. Avoid wearing underwire-style bras or extremely tight bras. °· Air dry your nipples for 3-4 minutes after each feeding. °· Use only cotton bra pads to absorb leaked breast milk. Leaking of breast milk between feedings is normal. °· Use lanolin on your nipples after  breastfeeding. Lanolin helps to maintain your skin's normal moisture barrier. If you use pure lanolin, you do not need to wash it off before feeding your baby again. Pure lanolin is not toxic to your baby. You may also hand express a few drops of breast milk and gently massage that milk into your   nipples and allow the milk to air dry. °In the first few weeks after giving birth, some women experience extremely full breasts (engorgement). Engorgement can make your breasts feel heavy, warm, and tender to the touch. Engorgement peaks within 3-5 days after you give birth. The following recommendations can help ease engorgement: °· Completely empty your breasts while breastfeeding or pumping. You may want to start by applying warm, moist heat (in the shower or with warm water-soaked hand towels) just before feeding or pumping. This increases circulation and helps the milk flow. If your baby does not completely empty your breasts while breastfeeding, pump any extra milk after he or she is finished. °· Wear a snug bra (nursing or regular) or tank top for 1-2 days to signal your body to slightly decrease milk production. °· Apply ice packs to your breasts, unless this is too uncomfortable for you. °· Make sure that your baby is latched on and positioned properly while breastfeeding. °If engorgement persists after 48 hours of following these recommendations, contact your health care provider or a lactation consultant. °OVERALL HEALTH CARE RECOMMENDATIONS WHILE BREASTFEEDING °· Eat healthy foods. Alternate between meals and snacks, eating 3 of each per day. Because what you eat affects your breast milk, some of the foods may make your baby more irritable than usual. Avoid eating these foods if you are sure that they are negatively affecting your baby. °· Drink milk, fruit juice, and water to satisfy your thirst (about 10 glasses a day). °· Rest often, relax, and continue to take your prenatal vitamins to prevent fatigue,  stress, and anemia. °· Continue breast self-awareness checks. °· Avoid chewing and smoking tobacco. Chemicals from cigarettes that pass into breast milk and exposure to secondhand smoke may harm your baby. °· Avoid alcohol and drug use, including marijuana. °Some medicines that may be harmful to your baby can pass through breast milk. It is important to ask your health care provider before taking any medicine, including all over-the-counter and prescription medicine as well as vitamin and herbal supplements. °It is possible to become pregnant while breastfeeding. If birth control is desired, ask your health care provider about options that will be safe for your baby. °SEEK MEDICAL CARE IF: °· You feel like you want to stop breastfeeding or have become frustrated with breastfeeding. °· You have painful breasts or nipples. °· Your nipples are cracked or bleeding. °· Your breasts are red, tender, or warm. °· You have a swollen area on either breast. °· You have a fever or chills. °· You have nausea or vomiting. °· You have drainage other than breast milk from your nipples. °· Your breasts do not become full before feedings by the fifth day after you give birth. °· You feel sad and depressed. °· Your baby is too sleepy to eat well. °· Your baby is having trouble sleeping.   °· Your baby is wetting less than 3 diapers in a 24-hour period. °· Your baby has less than 3 stools in a 24-hour period. °· Your baby's skin or the white part of his or her eyes becomes yellow.   °· Your baby is not gaining weight by 5 days of age. °SEEK IMMEDIATE MEDICAL CARE IF: °· Your baby is overly tired (lethargic) and does not want to wake up and feed. °· Your baby develops an unexplained fever. °  °This information is not intended to replace advice given to you by your health care provider. Make sure you discuss any questions you have with your   health care provider. °  °Document Released: 02/08/2005 Document Revised: 10/30/2014 Document  Reviewed: 08/02/2012 °Elsevier Interactive Patient Education ©2016 [employer] ° ° ° °

## 2015-09-16 ENCOUNTER — Encounter: Payer: Medicaid Other | Admitting: Obstetrics & Gynecology

## 2015-09-17 ENCOUNTER — Telehealth: Payer: Self-pay | Admitting: *Deleted

## 2015-09-17 NOTE — Telephone Encounter (Signed)
Pt states not having any symptoms at this time, informed if any symptoms develop to give [place] a call back otherwise take Fe 2X daily and PNV once daily.  Pt verbalized understanding.

## 2015-09-17 NOTE — Telephone Encounter (Signed)
Brandi from the [facility] office called to report pts hgb this morning was 7.2.  Pt delivered 4 days ago and is taking an OTC iron tablet as well as her PVN.

## 2015-10-28 ENCOUNTER — Ambulatory Visit: Payer: Medicaid Other | Admitting: Women's Health

## 2015-10-28 ENCOUNTER — Encounter: Payer: Self-pay | Admitting: *Deleted

## 2016-02-23 NOTE — L&D Delivery Note (Signed)
Delivery Note At 11:46 AM a viable female was delivered via Vaginal, Spontaneous Delivery (Presentation: ;  ).  APGAR: 8, 9; weight pending.   Placenta status: Intact.  Cord: 3v  with the following complications: Loose nuchal, None.  Cord pH: N/A  Anesthesia:   Episiotomy: None Lacerations:  None Suture Repair: N/A Est. Blood Loss (mL): 100  Mom to postpartum.  Baby to Couplet care / Skin to Skin.  Cassandra Berg 09/08/2016, 12:07 PM

## 2016-04-13 ENCOUNTER — Ambulatory Visit: Payer: Medicaid Other | Admitting: Adult Health

## 2016-05-12 ENCOUNTER — Ambulatory Visit: Payer: Medicaid Other | Admitting: Adult Health

## 2016-06-03 ENCOUNTER — Ambulatory Visit (INDEPENDENT_AMBULATORY_CARE_PROVIDER_SITE_OTHER): Payer: Medicaid Other | Admitting: Adult Health

## 2016-06-03 ENCOUNTER — Telehealth: Payer: Self-pay | Admitting: *Deleted

## 2016-06-03 ENCOUNTER — Encounter: Payer: Self-pay | Admitting: Adult Health

## 2016-06-03 VITALS — BP 118/54 | HR 72 | Ht 66.0 in | Wt 174.0 lb

## 2016-06-03 DIAGNOSIS — N912 Amenorrhea, unspecified: Secondary | ICD-10-CM

## 2016-06-03 DIAGNOSIS — Z363 Encounter for antenatal screening for malformations: Secondary | ICD-10-CM | POA: Insufficient documentation

## 2016-06-03 DIAGNOSIS — Z3201 Encounter for pregnancy test, result positive: Secondary | ICD-10-CM | POA: Diagnosis not present

## 2016-06-03 DIAGNOSIS — O09892 Supervision of other high risk pregnancies, second trimester: Secondary | ICD-10-CM

## 2016-06-03 DIAGNOSIS — Z3490 Encounter for supervision of normal pregnancy, unspecified, unspecified trimester: Secondary | ICD-10-CM

## 2016-06-03 LAB — POCT URINE PREGNANCY: Preg Test, Ur: POSITIVE — AB

## 2016-06-03 MED ORDER — CITRANATAL ASSURE 35-1 & 300 MG PO MISC: ORAL | 11 refills | Status: AC

## 2016-06-03 NOTE — Patient Instructions (Signed)

## 2016-06-03 NOTE — Progress Notes (Signed)
Subjective:     Patient ID: Cassandra Berg, female   DOB: 08-31-1990, 26 y.o.   MRN: [id number]  HPI Cassandra Berg is a 26 year old black female in for UPT, has missed several periods and had +HPT x 1, has +FM.Seen at [facility] HGB 9.2.She has 45 month old at home.   Review of Systems +missed periods with +HPT and +FM Reviewed past medical,surgical, social and family history. Reviewed medications and allergies.     Objective:   Physical Exam BP (!) 118/54 (BP Location: Left Arm, Patient Position: Sitting, Cuff Size: Normal)   Pulse 72   Ht  (1.676 m)   Wt 174 lb (78.9 kg)   LMP 12/17/2015   Breastfeeding? No   BMI 28.08 kg/m UPT + about 24+1 week by LMP with EDD 09/22/16,Skin warm and dry. Neck: mid line trachea, normal thyroid, good ROM, no lymphadenopathy noted. Lungs: clear to ausculation bilaterally. Cardiovascular: regular rate and rhythm.FH 26-27 CM, FHR 144, +FM    Assessment:     Pregnancy test positive - Plan: POCT urine pregnancy  Pregnancy, unspecified gestational age  Antenatal screening for malformation using ultrasonics - Plan: US OB DETAIL + 14 WK  Late presentation  Short interval between pregnancies      Plan:    Rx citranatal assure #60 take daily, with 11 refills Continue to take OTC iron Follow up in 4 days for anatomy US Review handout on second trimester

## 2016-06-03 NOTE — Telephone Encounter (Signed)
[facility] called to report patients HGB was 9.2 in their office this morning.  Pt has an appointment here today with Cyril Mourning, Victorino Dike made aware of results.

## 2016-06-07 ENCOUNTER — Ambulatory Visit: Payer: Medicaid Other | Admitting: Adult Health

## 2016-06-07 ENCOUNTER — Ambulatory Visit: Payer: Self-pay | Admitting: Adult Health

## 2016-06-07 ENCOUNTER — Ambulatory Visit (INDEPENDENT_AMBULATORY_CARE_PROVIDER_SITE_OTHER): Payer: Medicaid Other

## 2016-06-07 DIAGNOSIS — Z363 Encounter for antenatal screening for malformations: Secondary | ICD-10-CM | POA: Diagnosis not present

## 2016-06-07 NOTE — Progress Notes (Signed)
[place] 24+5 wks,cephalic,cx 4 cm,svp of fluid 4.5 cm,normal ovaries bilat,ant pl gr 0,efw 729 g 47%,fhr 136 bpm,anatomy complete,no obvious abnormalities seen,EDD 09/22/2016

## 2016-06-17 ENCOUNTER — Ambulatory Visit (INDEPENDENT_AMBULATORY_CARE_PROVIDER_SITE_OTHER): Payer: Medicaid Other | Admitting: Women's Health

## 2016-06-17 ENCOUNTER — Encounter: Payer: Self-pay | Admitting: *Deleted

## 2016-06-17 ENCOUNTER — Other Ambulatory Visit (HOSPITAL_COMMUNITY)
Admission: RE | Admit: 2016-06-17 | Discharge: 2016-06-17 | Disposition: A | Discharge status: Home / Self Care | Payer: Medicaid Other | Source: Ambulatory Visit | Attending: Obstetrics & Gynecology | Admitting: Obstetrics & Gynecology

## 2016-06-17 ENCOUNTER — Other Ambulatory Visit (INDEPENDENT_AMBULATORY_CARE_PROVIDER_SITE_OTHER): Payer: Medicaid Other | Admitting: *Deleted

## 2016-06-17 VITALS — BP 108/60 | HR 99 | Wt 171.0 lb

## 2016-06-17 DIAGNOSIS — Z3482 Encounter for supervision of other normal pregnancy, second trimester: Secondary | ICD-10-CM | POA: Diagnosis not present

## 2016-06-17 DIAGNOSIS — A5602 Chlamydial vulvovaginitis: Secondary | ICD-10-CM | POA: Insufficient documentation

## 2016-06-17 DIAGNOSIS — Z1389 Encounter for screening for other disorder: Secondary | ICD-10-CM

## 2016-06-17 DIAGNOSIS — B9689 Other specified bacterial agents as the cause of diseases classified elsewhere: Secondary | ICD-10-CM | POA: Insufficient documentation

## 2016-06-17 DIAGNOSIS — Z331 Pregnant state, incidental: Secondary | ICD-10-CM

## 2016-06-17 DIAGNOSIS — Z3A26 26 weeks gestation of pregnancy: Secondary | ICD-10-CM

## 2016-06-17 DIAGNOSIS — F1290 Cannabis use, unspecified, uncomplicated: Secondary | ICD-10-CM

## 2016-06-17 DIAGNOSIS — Z124 Encounter for screening for malignant neoplasm of cervix: Secondary | ICD-10-CM

## 2016-06-17 DIAGNOSIS — O98812 Other maternal infectious and parasitic diseases complicating pregnancy, second trimester: Secondary | ICD-10-CM | POA: Diagnosis not present

## 2016-06-17 DIAGNOSIS — O0932 Supervision of pregnancy with insufficient antenatal care, second trimester: Secondary | ICD-10-CM

## 2016-06-17 DIAGNOSIS — Z8759 Personal history of other complications of pregnancy, childbirth and the puerperium: Secondary | ICD-10-CM

## 2016-06-17 DIAGNOSIS — Z3490 Encounter for supervision of normal pregnancy, unspecified, unspecified trimester: Secondary | ICD-10-CM | POA: Insufficient documentation

## 2016-06-17 DIAGNOSIS — O09899 Supervision of other high risk pregnancies, unspecified trimester: Secondary | ICD-10-CM

## 2016-06-17 LAB — POCT URINALYSIS DIPSTICK
Blood, UA: NEGATIVE
GLUCOSE UA: NEGATIVE
Ketones, UA: NEGATIVE
Leukocytes, UA: NEGATIVE
NITRITE UA: NEGATIVE
Protein, UA: NEGATIVE

## 2016-06-17 NOTE — Patient Instructions (Signed)
You will have your sugar test next visit.  Please do not eat or drink anything after midnight the night before you come, not even water.  You will be here for at least two hours.     Call the office [phone]) or go to [facility] if:  You begin to have strong, frequent contractions  Your water breaks.  Sometimes it is a big gush of fluid, sometimes it is just a trickle that keeps getting your panties wet or running down your legs  You have vaginal bleeding.  It is normal to have a small amount of spotting if your cervix was checked.   You don't feel your baby moving like normal.  If you don't, get you something to eat and drink and lay down and focus on feeling your baby move.   If your baby is still not moving like normal, you should call the office or go to [facility].  Tdap Vaccine  It is recommended that you get the Tdap vaccine during the third trimester of EACH pregnancy to help protect your baby from getting pertussis (whooping cough)  27-36 weeks is the BEST time to do this so that you can pass the protection on to your baby. During pregnancy is better than after pregnancy, but if you are unable to get it during pregnancy it will be offered at the hospital.   You can get this vaccine at the health department or your family doctor  Everyone who will be around your baby should also be up-to-date on their vaccines. Adults (who are not pregnant) only need 1 dose of Tdap during adulthood.      Second Trimester of Pregnancy The second trimester is from week 13 through week 28, months 4 through 6. The second trimester is often a time when you feel your best. Your body has also adjusted to being pregnant, and you begin to feel better physically. Usually, morning sickness has lessened or quit completely, you may have more energy, and you may have an increase in appetite. The second trimester is also a time when the fetus is growing rapidly. At the end of the sixth month, the fetus  is about 9 inches long and weighs about 1 pounds. You will likely begin to feel the baby move (quickening) between 18 and 20 weeks of the pregnancy. BODY CHANGES Your body goes through many changes during pregnancy. The changes vary from woman to woman.   Your weight will continue to increase. You will notice your lower abdomen bulging out.  You may begin to get stretch marks on your hips, abdomen, and breasts.  You may develop headaches that can be relieved by medicines approved by your health care provider.  You may urinate more often because the fetus is pressing on your bladder.  You may develop or continue to have heartburn as a result of your pregnancy.  You may develop constipation because certain hormones are causing the muscles that push waste through your intestines to slow down.  You may develop hemorrhoids or swollen, bulging veins (varicose veins).  You may have back pain because of the weight gain and pregnancy hormones relaxing your joints between the bones in your pelvis and as a result of a shift in weight and the muscles that support your balance.  Your breasts will continue to grow and be tender.  Your gums may bleed and may be sensitive to brushing and flossing.  Dark spots or blotches (chloasma, mask of pregnancy) may develop on  your face. This will likely fade after the baby is born.  A dark line from your belly button to the pubic area (linea nigra) may appear. This will likely fade after the baby is born.  You may have changes in your hair. These can include thickening of your hair, rapid growth, and changes in texture. Some women also have hair loss during or after pregnancy, or hair that feels dry or thin. Your hair will most likely return to normal after your baby is born. WHAT TO EXPECT AT YOUR PRENATAL VISITS During a routine prenatal visit:  You will be weighed to make sure you and the fetus are growing normally.  Your blood pressure will be  taken.  Your abdomen will be measured to track your baby's growth.  The fetal heartbeat will be listened to.  Any test results from the previous visit will be discussed. Your health care provider may ask you:  How you are feeling.  If you are feeling the baby move.  If you have had any abnormal symptoms, such as leaking fluid, bleeding, severe headaches, or abdominal cramping.  If you have any questions. Other tests that may be performed during your second trimester include:  Blood tests that check for:  Low iron levels (anemia).  Gestational diabetes (between 24 and 28 weeks).  Rh antibodies.  Urine tests to check for infections, diabetes, or protein in the urine.  An ultrasound to confirm the proper growth and development of the baby.  An amniocentesis to check for possible genetic problems.  Fetal screens for spina bifida and Down syndrome. HOME CARE INSTRUCTIONS   Avoid all smoking, herbs, alcohol, and unprescribed drugs. These chemicals affect the formation and growth of the baby.  Follow your health care provider's instructions regarding medicine use. There are medicines that are either safe or unsafe to take during pregnancy.  Exercise only as directed by your health care provider. Experiencing uterine cramps is a good sign to stop exercising.  Continue to eat regular, healthy meals.  Wear a good support bra for breast tenderness.  Do not use hot tubs, steam rooms, or saunas.  Wear your seat belt at all times when driving.  Avoid raw meat, uncooked cheese, cat litter boxes, and soil used by cats. These carry germs that can cause birth defects in the baby.  Take your prenatal vitamins.  Try taking a stool softener (if your health care provider approves) if you develop constipation. Eat more high-fiber foods, such as fresh vegetables or fruit and whole grains. Drink plenty of fluids to keep your urine clear or pale yellow.  Take warm sitz baths to soothe  any pain or discomfort caused by hemorrhoids. Use hemorrhoid cream if your health care provider approves.  If you develop varicose veins, wear support hose. Elevate your feet for 15 minutes, 3-4 times a day. Limit salt in your diet.  Avoid heavy lifting, wear low heel shoes, and practice good posture.  Rest with your legs elevated if you have leg cramps or low back pain.  Visit your dentist if you have not gone yet during your pregnancy. Use a soft toothbrush to brush your teeth and be gentle when you floss.  A sexual relationship may be continued unless your health care provider directs you otherwise.  Continue to go to all your prenatal visits as directed by your health care provider. SEEK MEDICAL CARE IF:   You have dizziness.  You have mild pelvic cramps, pelvic pressure, or nagging pain in  the abdominal area.  You have persistent nausea, vomiting, or diarrhea.  You have a bad smelling vaginal discharge.  You have pain with urination. SEEK IMMEDIATE MEDICAL CARE IF:   You have a fever.  You are leaking fluid from your vagina.  You have spotting or bleeding from your vagina.  You have severe abdominal cramping or pain.  You have rapid weight gain or loss.  You have shortness of breath with chest pain.  You notice sudden or extreme swelling of your face, hands, ankles, feet, or legs.  You have not felt your baby move in over an hour.  You have severe headaches that do not go away with medicine.  You have vision changes. Document Released: 02/02/2001 Document Revised: 02/13/2013 Document Reviewed: 04/11/2012 [facility] Patient Information 2015 [place], [place]. This information is not intended to replace advice given to you by your health care provider. Make sure you discuss any questions you have with your health care provider.

## 2016-06-17 NOTE — Progress Notes (Signed)
  Subjective:  Cassandra Berg is a 26 y.o. G43P2002 African American female at [redacted]w[redacted]d by LMP c/w 24wk u/s, being seen today for her first obstetrical visit.  Her obstetrical history is significant for late to care @ 26wks- states she took test in Feb and [facility] was inactive, h/o term svb x 1, shoulder dystocia w/ 1st birth. Short interval pregnancy- last birth 09/13/15.   Pregnancy history fully reviewed.  Patient reports no complaints. Denies vb, cramping, uti s/s, abnormal/malodorous vag d/c, or vulvovaginal itching/irritation.  BP 108/60   Pulse 99   Wt 171 lb (77.6 kg)   LMP 12/17/2015 (Approximate)   BMI 27.60 kg/m   HISTORY: OB History  Gravida Para Term Preterm AB Living  SAB TAB Ectopic Multiple Live Births        0 2    # Outcome Date GA Lbr Len/2nd Weight Sex Delivery Anes PTL Lv  3 Current           2 Term 09/13/15 [redacted]w[redacted]d 15:30 / 00:39 9 lb 2.6 oz (4.155 kg) M Vag-Spont EPI N LIV     Birth Comments: Maternal THC use  1 Term 02/16/10 [redacted]w[redacted]d  7 lb 15 oz (3.6 kg) M Vag-Spont EPI N LIV     Past Medical History:  Diagnosis Date  . Anemia   . Vaginal Pap smear, abnormal    Past Surgical History:  Procedure Laterality Date  . WISDOM TOOTH EXTRACTION     Family History  Problem Relation Age of Onset  . Heart disease Mother   . Stroke Mother   . Hypertension Mother   . Diabetes Father   . Hyperlipidemia Father   . Cancer Maternal Grandmother     lung  . Diabetes Paternal Grandmother     Exam   System:     General: Well developed & nourished, no acute distress   Skin: Warm & dry, normal coloration and turgor, no rashes   Neurologic: Alert & oriented, normal mood   Cardiovascular: Regular rate & rhythm   Respiratory: Effort & rate normal, LCTAB, acyanotic   Abdomen: Soft, non tender, ? Abdominal hernia- soft, no pain   Extremities: normal strength, tone   Pelvic Exam:    Perineum: Normal perineum   Vulva: Normal, no lesions   Vagina:  Normal  mucosa, normal discharge   Cervix: Normal, bulbous, appears closed   Uterus: Normal size/shape/contour for GA   Thin prep pap smear obtained w/ reflex high risk HPV cotesting FHR: 155 via dopp FH: 24cm   Assessment:   Pregnancy: [id number] Patient Active Problem List   Diagnosis Date Noted  . Supervision of normal pregnancy 06/17/2016  . Late prenatal care affecting pregnancy in second trimester 06/17/2016  . Short interval between pregnancies affecting pregnancy, antepartum 06/17/2016  . 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 History of shoulder dystocia in prior pregnancy 02/26/2015    [redacted]w[redacted]d G3P2002 New OB visit Late care h/o shoulder dystocia 1st baby Short interval pregnancy  Plan:  Initial labs obtained Continue prenatal vitamins Problem list reviewed and updated Reviewed n/v relief measures and warning s/s to report Reviewed recommended weight gain based on pre-gravid BMI Encouraged well-balanced diet Genetic Screening discussed Quad Screen: too late Cystic fibrosis screening discussed declined Ultrasound discussed; fetal survey: results reviewed Follow up in 2 weeks for visit and pn2 CCNC completed  Marge Duncans CNM, [facility] 06/17/2016 11:08 AM

## 2016-06-18 ENCOUNTER — Telehealth: Payer: Self-pay | Admitting: *Deleted

## 2016-06-18 ENCOUNTER — Encounter: Payer: Self-pay | Admitting: Women's Health

## 2016-06-18 DIAGNOSIS — O99019 Anemia complicating pregnancy, unspecified trimester: Secondary | ICD-10-CM | POA: Insufficient documentation

## 2016-06-18 LAB — PMP SCREEN PROFILE (10S), URINE
Amphetamine Screen, Ur: NEGATIVE ng/mL
BENZODIAZEPINE SCREEN, URINE: NEGATIVE ng/mL
Barbiturate Screen, Ur: NEGATIVE ng/mL
CANNABINOIDS UR QL SCN: POSITIVE ng/mL
COCAINE(METAB.) SCREEN, URINE: NEGATIVE ng/mL
Creatinine(Crt), U: 191.9 mg/dL (ref 20.0–300.0)
Methadone Scn, Ur: NEGATIVE ng/mL
OPIATE SCRN UR: NEGATIVE ng/mL
OXYCODONE+OXYMORPHONE UR QL SCN: NEGATIVE ng/mL
PCP Scrn, Ur: NEGATIVE ng/mL
Ph of Urine: 6.4 (ref 4.5–8.9)
Propoxyphene, Screen: NEGATIVE ng/mL

## 2016-06-18 LAB — ABO/RH: Rh Factor: POSITIVE

## 2016-06-18 LAB — CBC
HEMATOCRIT: 30.5 % — AB (ref 34.0–46.6)
Hemoglobin: 9.5 g/dL — ABNORMAL LOW (ref 11.1–15.9)
MCH: 26.9 pg (ref 26.6–33.0)
MCHC: 31.1 g/dL — AB (ref 31.5–35.7)
MCV: 86 fL (ref 79–97)
Platelets: 282 10*3/uL (ref 150–379)
RBC: 3.53 x10E6/uL — ABNORMAL LOW (ref 3.77–5.28)
RDW: 16.9 % — AB (ref 12.3–15.4)
WBC: 8.7 10*3/uL (ref 3.4–10.8)

## 2016-06-18 LAB — URINALYSIS, ROUTINE W REFLEX MICROSCOPIC
Bilirubin, UA: NEGATIVE
GLUCOSE, UA: NEGATIVE
KETONES UA: NEGATIVE
Leukocytes, UA: NEGATIVE
NITRITE UA: NEGATIVE
Protein, UA: NEGATIVE
RBC UA: NEGATIVE
SPEC GRAV UA: 1.028 (ref 1.005–1.030)
UUROB: 0.2 mg/dL (ref 0.2–1.0)
pH, UA: 6.5 (ref 5.0–7.5)

## 2016-06-18 LAB — CYTOLOGY - PAP
Chlamydia: POSITIVE — AB
DIAGNOSIS: NEGATIVE
Neisseria Gonorrhea: NEGATIVE

## 2016-06-18 LAB — VARICELLA ZOSTER ANTIBODY, IGG: VARICELLA: 1344 {index} (ref >165)

## 2016-06-18 LAB — HIV ANTIBODY (ROUTINE TESTING W REFLEX): HIV Screen 4th Generation wRfx: NONREACTIVE

## 2016-06-18 LAB — HEPATITIS B SURFACE ANTIGEN: Hepatitis B Surface Ag: NEGATIVE

## 2016-06-18 LAB — RPR: RPR: NONREACTIVE

## 2016-06-18 LAB — RUBELLA SCREEN: RUBELLA: 2.45 {index} (ref >0.99)

## 2016-06-18 LAB — ANTIBODY SCREEN: ANTIBODY SCREEN: NEGATIVE

## 2016-06-18 NOTE — Telephone Encounter (Signed)
LMOM. Advised pt that her Hemoglobin was 9.5. Advised her to take her iron twice a day with OJ if possible. Advised her to increase her intake of iron rich foods (red meat, green leafy veg) and to cook in a cast iron skillet if possible. Advised pt to call us back if she had any questions.

## 2016-06-19 LAB — URINE CULTURE

## 2016-06-21 ENCOUNTER — Other Ambulatory Visit: Payer: Self-pay | Admitting: Women's Health

## 2016-06-21 ENCOUNTER — Encounter: Payer: Self-pay | Admitting: Women's Health

## 2016-06-21 ENCOUNTER — Telehealth: Payer: Self-pay | Admitting: *Deleted

## 2016-06-21 DIAGNOSIS — A749 Chlamydial infection, unspecified: Secondary | ICD-10-CM | POA: Insufficient documentation

## 2016-06-21 DIAGNOSIS — O98812 Other maternal infectious and parasitic diseases complicating pregnancy, second trimester: Secondary | ICD-10-CM

## 2016-06-21 MED ORDER — AZITHROMYCIN 500 MG PO TABS: 1000.0000 mg | ORAL_TABLET | Freq: Once | ORAL | 0 refills | Status: AC

## 2016-06-21 NOTE — Telephone Encounter (Signed)
LMOVM to return call.

## 2016-06-22 ENCOUNTER — Telehealth: Payer: Self-pay | Admitting: *Deleted

## 2016-06-22 NOTE — Telephone Encounter (Signed)
Informed patient of positive chlamydia test and need for treatment. Patient states FOB will go to HD for treatment. Informed no sex for 7 days until both treated and will need POC at next visit. Pt verbalized understanding.

## 2016-07-01 ENCOUNTER — Encounter: Payer: Medicaid Other | Admitting: Obstetrics & Gynecology

## 2016-07-01 ENCOUNTER — Other Ambulatory Visit: Payer: Medicaid Other

## 2016-07-08 ENCOUNTER — Other Ambulatory Visit: Payer: Medicaid Other

## 2016-07-08 ENCOUNTER — Encounter: Payer: Self-pay | Admitting: Obstetrics & Gynecology

## 2016-07-08 ENCOUNTER — Ambulatory Visit (INDEPENDENT_AMBULATORY_CARE_PROVIDER_SITE_OTHER): Payer: Medicaid Other | Admitting: Obstetrics & Gynecology

## 2016-07-08 VITALS — BP 110/50 | HR 80 | Wt 173.0 lb

## 2016-07-08 DIAGNOSIS — Z3A29 29 weeks gestation of pregnancy: Secondary | ICD-10-CM

## 2016-07-08 DIAGNOSIS — Z3483 Encounter for supervision of other normal pregnancy, third trimester: Secondary | ICD-10-CM

## 2016-07-08 DIAGNOSIS — Z3493 Encounter for supervision of normal pregnancy, unspecified, third trimester: Secondary | ICD-10-CM

## 2016-07-08 DIAGNOSIS — Z131 Encounter for screening for diabetes mellitus: Secondary | ICD-10-CM

## 2016-07-08 DIAGNOSIS — Z1389 Encounter for screening for other disorder: Secondary | ICD-10-CM

## 2016-07-08 DIAGNOSIS — Z331 Pregnant state, incidental: Secondary | ICD-10-CM

## 2016-07-08 LAB — POCT URINALYSIS DIPSTICK
GLUCOSE UA: NEGATIVE
Ketones, UA: NEGATIVE
Leukocytes, UA: NEGATIVE
NITRITE UA: NEGATIVE
PROTEIN UA: NEGATIVE
RBC UA: NEGATIVE

## 2016-07-08 NOTE — Addendum Note (Signed)
Addended by: Moss McRESENZO, Kanai Hilger M on: 07/08/2016 09:21 AM   Modules accepted: Orders

## 2016-07-08 NOTE — Progress Notes (Signed)
[id number] 5873w1d Estimated Date of Delivery: 09/22/16  Blood pressure (!) 110/50, pulse 80, weight 173 lb (78.5 kg), last menstrual period 12/17/2015, not currently breastfeeding.   BP weight and urine results all reviewed and noted.  Please refer to the obstetrical flow sheet for the fundal height and fetal heart rate documentation:  Patient reports good fetal movement, denies any bleeding and no rupture of membranes symptoms or regular contractions. Patient is without complaints. All questions were answered.  Orders Placed This Encounter  Procedures  . POCT urinalysis dipstick    Plan:  Continued routine obstetrical care, PN2 today  No Follow-up on file.

## 2016-07-08 NOTE — Addendum Note (Signed)
Addended by: Gaylyn RongEVANS, Jonika Critz A on: 07/08/2016 09:39 AM   Modules accepted: Orders

## 2016-07-09 LAB — CBC
HEMOGLOBIN: 9.7 g/dL — AB (ref 11.1–15.9)
Hematocrit: 30.4 % — ABNORMAL LOW (ref 34.0–46.6)
MCH: 27.4 pg (ref 26.6–33.0)
MCHC: 31.9 g/dL (ref 31.5–35.7)
MCV: 86 fL (ref 79–97)
PLATELETS: 314 10*3/uL (ref 150–379)
RBC: 3.54 x10E6/uL — AB (ref 3.77–5.28)
RDW: 16.9 % — ABNORMAL HIGH (ref 12.3–15.4)
WBC: 9.7 10*3/uL (ref 3.4–10.8)

## 2016-07-09 LAB — GLUCOSE TOLERANCE, 2 HOURS W/ 1HR
Glucose, 1 hour: 108 mg/dL (ref 65–179)
Glucose, 2 hour: 83 mg/dL (ref 65–152)
Glucose, Fasting: 78 mg/dL (ref 65–91)

## 2016-07-09 LAB — HIV ANTIBODY (ROUTINE TESTING W REFLEX): HIV SCREEN 4TH GENERATION: NONREACTIVE

## 2016-07-09 LAB — RPR: RPR: NONREACTIVE

## 2016-07-09 LAB — ANTIBODY SCREEN: Antibody Screen: NEGATIVE

## 2016-07-21 ENCOUNTER — Telehealth: Payer: Self-pay | Admitting: Advanced Practice Midwife

## 2016-07-21 NOTE — Telephone Encounter (Signed)
LMOVM to return call.

## 2016-07-21 NOTE — Telephone Encounter (Signed)
LMVOM to return call. 

## 2016-07-23 NOTE — Telephone Encounter (Signed)
Spoke to patient who states she is feeling better. Encouraged to push fluids. Verbalized understanding.

## 2016-07-29 ENCOUNTER — Encounter: Payer: Self-pay | Admitting: Obstetrics and Gynecology

## 2016-07-29 ENCOUNTER — Ambulatory Visit (INDEPENDENT_AMBULATORY_CARE_PROVIDER_SITE_OTHER): Payer: Medicaid Other | Admitting: Obstetrics and Gynecology

## 2016-07-29 VITALS — BP 102/60 | HR 88 | Wt 174.8 lb

## 2016-07-29 DIAGNOSIS — Z118 Encounter for screening for other infectious and parasitic diseases: Secondary | ICD-10-CM

## 2016-07-29 DIAGNOSIS — O99013 Anemia complicating pregnancy, third trimester: Secondary | ICD-10-CM

## 2016-07-29 DIAGNOSIS — Z1389 Encounter for screening for other disorder: Secondary | ICD-10-CM

## 2016-07-29 DIAGNOSIS — Z1159 Encounter for screening for other viral diseases: Secondary | ICD-10-CM

## 2016-07-29 DIAGNOSIS — D649 Anemia, unspecified: Secondary | ICD-10-CM | POA: Diagnosis not present

## 2016-07-29 DIAGNOSIS — Z331 Pregnant state, incidental: Secondary | ICD-10-CM

## 2016-07-29 DIAGNOSIS — Z3483 Encounter for supervision of other normal pregnancy, third trimester: Secondary | ICD-10-CM

## 2016-07-29 LAB — POCT URINALYSIS DIPSTICK
Blood, UA: NEGATIVE
GLUCOSE UA: NEGATIVE
KETONES UA: NEGATIVE
Nitrite, UA: NEGATIVE
Protein, UA: NEGATIVE

## 2016-07-29 LAB — POCT HEMOGLOBIN: HEMOGLOBIN: 8.8 g/dL — AB (ref 12.2–16.2)

## 2016-07-29 NOTE — Progress Notes (Addendum)
Patient ID: Cassandra RuddleLashanda M Canto, female   DOB: 1990-03-27, 26 y.o.   MRN: [id number]  [id number]  Estimated Date of Delivery: 09/22/16 LROB 3464w1d                      Proof of cure for + Chlamydia test , treated. Chief Complaint  Patient presents with  . Routine Prenatal Visit    need POC for chlamydia, light headed on and off  ____  Patient complaints: Pt reports she has been intermittently dizzy. Her Hgb today is 8.8. Per pt, she has been taking Fe supplements 2x/day since her last Hgb of 9.7 3w ago.   She plans on bottle and breast feeding. BC after delivery is tubal ligation. Tubal papers are signed.   Patient reports good fetal movement. She denies any bleeding, rupture of membranes,or regular contractions.  Blood pressure 102/60, pulse 88, weight 174 lb 12.8 oz (79.3 kg), last menstrual period 12/17/2015, not currently breastfeeding.   Urine results:notable for +1 leukocytes  refer to the ob flow sheet for FH and FHR, ,                          Physical Examination: General appearance - alert, well appearing, and in no distress                                      Abdomen - FH 33 cm                                                        -FHR 160 bpm                                                         soft, nontender, nondistended, no masses or organomegaly                                      Pelvic - VULVA: normal appearing vulva with no masses, tenderness or lesions, VAGINA: normal appearing vagina with normal color and discharge, no lesions  [facility]                                            Questions were answered. Assessment: LROB G3P2002 @ 3364w1d                         Hx chlamydia for POC_____                        Anemia, on FE bid Plan:   Continued routine obstetrical care Continue Fe supplements  Recheck Hgb at next visit   F/u in 2 weeks for routine prenatal care, Hgb recheck    By signing my name below, I, Doreatha MartinEva Mathews, attest that this  documentation  has been prepared under the direction and in the presence of Tilda Burrow, MD. Electronically Signed: Doreatha Martin, ED Scribe. 07/29/16. 11:43 AM.  I personally performed the services described in this documentation, which was SCRIBED in my presence. The recorded information has been reviewed and considered accurate. It has been edited as necessary during review. Tilda Burrow, MD

## 2016-07-31 LAB — GC/CHLAMYDIA PROBE AMP
Chlamydia trachomatis, NAA: NEGATIVE
Neisseria gonorrhoeae by PCR: NEGATIVE

## 2016-08-12 ENCOUNTER — Ambulatory Visit (INDEPENDENT_AMBULATORY_CARE_PROVIDER_SITE_OTHER): Payer: Medicaid Other | Admitting: Advanced Practice Midwife

## 2016-08-12 ENCOUNTER — Encounter: Payer: Self-pay | Admitting: Advanced Practice Midwife

## 2016-08-12 VITALS — BP 102/60 | HR 92 | Wt 174.0 lb

## 2016-08-12 DIAGNOSIS — Z331 Pregnant state, incidental: Secondary | ICD-10-CM | POA: Diagnosis not present

## 2016-08-12 DIAGNOSIS — A749 Chlamydial infection, unspecified: Secondary | ICD-10-CM

## 2016-08-12 DIAGNOSIS — O98812 Other maternal infectious and parasitic diseases complicating pregnancy, second trimester: Secondary | ICD-10-CM

## 2016-08-12 DIAGNOSIS — Z1389 Encounter for screening for other disorder: Secondary | ICD-10-CM

## 2016-08-12 LAB — POCT URINALYSIS DIPSTICK
Glucose, UA: NEGATIVE
Ketones, UA: NEGATIVE
Leukocytes, UA: NEGATIVE
NITRITE UA: NEGATIVE
Protein, UA: NEGATIVE
RBC UA: NEGATIVE

## 2016-08-12 MED ORDER — FERRALET 90 90-1 MG PO TABS: 1.0000 | ORAL_TABLET | Freq: Two times a day (BID) | ORAL | 3 refills | Status: DC

## 2016-08-12 NOTE — Progress Notes (Signed)
`   [id number] 6031w1d Estimated Date of Delivery: 09/22/16  Blood pressure 102/60, pulse 92, weight 174 lb (78.9 kg), last menstrual period 12/17/2015, not currently breastfeeding.   BP weight and urine results all reviewed and noted.Eats.  "all the time".    Please refer to the obstetrical flow sheet for the fundal height and fetal heart rate documentation:  Patient reports good fetal movement, denies any bleeding and no rupture of membranes symptoms or regular contractions. Patient is without complaints. All questions were answered.  Orders Placed This Encounter  Procedures  . POCT urinalysis dipstick    Plan:  Continued routine obstetrical care, cHANGED iron to ferralet 90 BID,  Now totake on empty stomach wVit C (wansnt' doing that).  Consider Ferraheme IV   Return in about 2 weeks (around 08/26/2016) for LROB.

## 2016-08-16 ENCOUNTER — Telehealth: Payer: Self-pay | Admitting: Advanced Practice Midwife

## 2016-08-16 NOTE — Telephone Encounter (Signed)
Informed [facility]

## 2016-08-16 NOTE — Telephone Encounter (Signed)
Pt called and left a message on the answering machine stating that she was suppose to have medication called in to her pharmacy and when she got there they stated that we did not send one. Please contact pt

## 2016-08-23 ENCOUNTER — Telehealth: Payer: Self-pay | Admitting: *Deleted

## 2016-08-23 NOTE — Telephone Encounter (Signed)
LMOVM that Rx for Iron had been [facility] [place] on 08/12/2016.

## 2016-08-26 ENCOUNTER — Ambulatory Visit (INDEPENDENT_AMBULATORY_CARE_PROVIDER_SITE_OTHER): Payer: Medicaid Other | Admitting: Women's Health

## 2016-08-26 ENCOUNTER — Encounter: Payer: Self-pay | Admitting: Women's Health

## 2016-08-26 VITALS — BP 80/50 | HR 88 | Wt 174.0 lb

## 2016-08-26 DIAGNOSIS — Z3483 Encounter for supervision of other normal pregnancy, third trimester: Secondary | ICD-10-CM

## 2016-08-26 DIAGNOSIS — O99013 Anemia complicating pregnancy, third trimester: Secondary | ICD-10-CM

## 2016-08-26 DIAGNOSIS — Z3A36 36 weeks gestation of pregnancy: Secondary | ICD-10-CM

## 2016-08-26 DIAGNOSIS — Z331 Pregnant state, incidental: Secondary | ICD-10-CM

## 2016-08-26 DIAGNOSIS — Z1389 Encounter for screening for other disorder: Secondary | ICD-10-CM

## 2016-08-26 MED ORDER — FERRALET 90 90-1 MG PO TABS: 1.0000 | ORAL_TABLET | Freq: Every day | ORAL | 3 refills | Status: DC

## 2016-08-26 NOTE — Progress Notes (Signed)
Low-risk OB appointment [id number] 6936w1d Estimated Date of Delivery: 09/22/16 BP (!) 80/50   Pulse 88   Wt 174 lb (78.9 kg)   LMP 12/17/2015 (Approximate)   BMI 28.08 kg/m   BP, weight reviewed. Unable to void.  Refer to obstetrical flow sheet for FH & FHR.  Reports good fm.  Denies regular uc's, lof, vb, or uti s/s. No complaints. Hasn't been able to pick up Ferralet 90 rx from pharmacy, they keep telling her they don't have it. Will send again today.  Wants cervix checked: 3/50/-2, vtx Reviewed ptl s/s, fkc. Plan:  Continue routine obstetrical care  F/U in 1wk for OB appointment and gbs

## 2016-08-26 NOTE — Patient Instructions (Signed)
Call the office ([phone]) or go to [facility] if:  You begin to have strong, frequent contractions  Your water breaks.  Sometimes it is a big gush of fluid, sometimes it is just a trickle that keeps getting your panties wet or running down your legs  You have vaginal bleeding.  It is normal to have a small amount of spotting if your cervix was checked.   You don't feel your baby moving like normal.  If you don't, get you something to eat and drink and lay down and focus on feeling your baby move.  You should feel at least 10 movements in 2 hours.  If you don't, you should call the office or go to [facility].     Preterm Labor and Birth Information The normal length of a pregnancy is 39-41 weeks. Preterm labor is when labor starts before 37 completed weeks of pregnancy. What are the risk factors for preterm labor? Preterm labor is more likely to occur in women who:  Have certain infections during pregnancy such as a bladder infection, sexually transmitted infection, or infection inside the uterus (chorioamnionitis).  Have a shorter-than-normal cervix.  Have gone into preterm labor before.  Have had surgery on their cervix.  Are younger than age 17 or older than age 35.  Are African American.  Are pregnant with twins or multiple babies (multiple gestation).  Take street drugs or smoke while pregnant.  Do not gain enough weight while pregnant.  Became pregnant shortly after having been pregnant.  What are the symptoms of preterm labor? Symptoms of preterm labor include:  Cramps similar to those that can happen during a menstrual period. The cramps may happen with diarrhea.  Pain in the abdomen or lower back.  Regular uterine contractions that may feel like tightening of the abdomen.  A feeling of increased pressure in the pelvis.  Increased watery or bloody mucus discharge from the vagina.  Water breaking (ruptured amniotic sac).  Why is it important to  recognize signs of preterm labor? It is important to recognize signs of preterm labor because babies who are born prematurely may not be fully developed. This can put them at an increased risk for:  Long-term (chronic) heart and lung problems.  Difficulty immediately after birth with regulating body systems, including blood sugar, body temperature, heart rate, and breathing rate.  Bleeding in the brain.  Cerebral palsy.  Learning difficulties.  Death.  These risks are highest for babies who are born before 34 weeks of pregnancy. How is preterm labor treated? Treatment depends on the length of your pregnancy, your condition, and the health of your baby. It may involve:  Having a stitch (suture) placed in your cervix to prevent your cervix from opening too early (cerclage).  Taking or being given medicines, such as: ? Hormone medicines. These may be given early in pregnancy to help support the pregnancy. ? Medicine to stop contractions. ? Medicines to help mature the baby's lungs. These may be prescribed if the risk of delivery is high. ? Medicines to prevent your baby from developing cerebral palsy.  If the labor happens before 34 weeks of pregnancy, you may need to stay in the hospital. What should I do if I think I am in preterm labor? If you think that you are going into preterm labor, call your health care provider right away. How can I prevent preterm labor in future pregnancies? To increase your chance of having a full-term pregnancy:  Do not use   any tobacco products, such as cigarettes, chewing tobacco, and e-cigarettes. If you need help quitting, ask your health care provider.  Do not use street drugs or medicines that have not been prescribed to you during your pregnancy.  Talk with your health care provider before taking any herbal supplements, even if you have been taking them regularly.  Make sure you gain a healthy amount of weight during your pregnancy.  Watch  for infection. If you think that you might have an infection, get it checked right away.  Make sure to tell your health care provider if you have gone into preterm labor before.  This information is not intended to replace advice given to you by your health care provider. Make sure you discuss any questions you have with your health care provider. Document Released: 05/01/2003 Document Revised: 07/22/2015 Document Reviewed: 07/02/2015 Elsevier Interactive Patient Education  2018 [employer] Inc[employer]  

## 2016-08-31 ENCOUNTER — Encounter (HOSPITAL_COMMUNITY): Payer: Self-pay | Admitting: *Deleted

## 2016-08-31 ENCOUNTER — Inpatient Hospital Stay (HOSPITAL_COMMUNITY)
Admission: AD | Admit: 2016-08-31 | Discharge: 2016-08-31 | Disposition: A | Discharge status: Home / Self Care | Payer: Medicaid Other | Source: Ambulatory Visit | Attending: Family Medicine | Admitting: Family Medicine

## 2016-08-31 DIAGNOSIS — O4703 False labor before 37 completed weeks of gestation, third trimester: Secondary | ICD-10-CM | POA: Insufficient documentation

## 2016-08-31 DIAGNOSIS — O479 False labor, unspecified: Secondary | ICD-10-CM

## 2016-08-31 DIAGNOSIS — Z3A36 36 weeks gestation of pregnancy: Secondary | ICD-10-CM | POA: Diagnosis not present

## 2016-08-31 LAB — URINALYSIS, ROUTINE W REFLEX MICROSCOPIC
Bilirubin Urine: NEGATIVE
GLUCOSE, UA: NEGATIVE mg/dL
KETONES UR: NEGATIVE mg/dL
NITRITE: NEGATIVE
PROTEIN: NEGATIVE mg/dL
Specific Gravity, Urine: 1.002 — ABNORMAL LOW (ref 1.005–1.030)
pH: 7 (ref 5.0–8.0)

## 2016-08-31 LAB — POCT FERN TEST: POCT Fern Test: NEGATIVE

## 2016-08-31 LAB — AMNISURE RUPTURE OF MEMBRANE (ROM) NOT AT ARMC: AMNISURE: NEGATIVE

## 2016-08-31 NOTE — MAU Provider Note (Signed)
S: Ms. Cassandra Berg is a 26 y.o. G3P2002 at 1323w6d  who presents to MAU today complaining of leaking of fluid since 5PM. Denies any gushes of fluid, just wetting her underwear. She denies vaginal bleeding. She endorses contractions every 3 minutes. She reports normal fetal movement.    O: BP 110/69   Pulse 86   Temp 97.9 F (36.6 C) (Oral)   Resp 18   Ht 5\' 6"  (1.676 m)   Wt 174 lb (78.9 kg)   LMP 12/17/2015 (Approximate)   BMI 28.08 kg/m  GENERAL: Well-developed, well-nourished female in no acute distress.  HEAD: Normocephalic, atraumatic.  CHEST: Normal effort of breathing, regular heart rate ABDOMEN: Soft, nontender, gravid PELVIC: Normal external female genitalia. Vagina is pink and rugated. Cervix with normal contour, no lesions, visually dilated to about 2cm. Normal discharge.  NEG pooling. NEG fern. Amniosure collected.   Cervical exam: 3cm/70% per Karl Itoiana Ansah-Mensah, RN  Fetal Monitoring: Baseline: 125 bpm Variability: moderate Accelerations: present, 15x15 Decelerations: none Contractions: q373min  Results for orders placed or performed during the hospital encounter of 08/31/16 (from the past 24 hour(s))  Urinalysis, Routine w reflex microscopic     Status: Abnormal   Collection Time: 08/31/16  9:00 PM  Result Value Ref Range   Color, Urine YELLOW YELLOW   APPearance HAZY (A) CLEAR   Specific Gravity, Urine 1.002 (L) 1.005 - 1.030   pH 7.0 5.0 - 8.0   Glucose, UA NEGATIVE NEGATIVE mg/dL   Hgb urine dipstick SMALL (A) NEGATIVE   Bilirubin Urine NEGATIVE NEGATIVE   Ketones, ur NEGATIVE NEGATIVE mg/dL   Protein, ur NEGATIVE NEGATIVE mg/dL   Nitrite NEGATIVE NEGATIVE   Leukocytes, UA LARGE (A) NEGATIVE   RBC / HPF 0-5 0 - 5 RBC/hpf   WBC, UA 6-30 0 - 5 WBC/hpf   Bacteria, UA RARE (A) NONE SEEN   Squamous Epithelial / LPF 0-5 (A) NONE SEEN     A: SIUP at 4323w6d  Membranes intact  P: Labor check, will recheck for cervical dilation change   Michaele OfferMumaw, Adain Geurin  [place], DO 08/31/2016 10:02 PM

## 2016-08-31 NOTE — MAU Provider Note (Signed)
  MDM: Rupture of membranes not found, cervix unchanged in 1 hour in MAU. FHR tracing reactive. See previous noted by Dr Lavell IslamMummaw for full evaluation. D/C home with labor precautions, pt stable at time of discharge.  A: 1. False labor     P: D/C home FU in office as scheduled Return to MAU with emergencies or s/sx of labor  Sharen CounterLisa Leftwich-Kirby, CNM 11:09 PM

## 2016-09-02 ENCOUNTER — Encounter: Payer: Self-pay | Admitting: Women's Health

## 2016-09-02 ENCOUNTER — Ambulatory Visit (INDEPENDENT_AMBULATORY_CARE_PROVIDER_SITE_OTHER): Payer: Medicaid Other | Admitting: Women's Health

## 2016-09-02 VITALS — BP 80/50 | HR 88 | Wt 178.0 lb

## 2016-09-02 DIAGNOSIS — Z331 Pregnant state, incidental: Secondary | ICD-10-CM

## 2016-09-02 DIAGNOSIS — Z3483 Encounter for supervision of other normal pregnancy, third trimester: Secondary | ICD-10-CM

## 2016-09-02 DIAGNOSIS — Z1389 Encounter for screening for other disorder: Secondary | ICD-10-CM | POA: Diagnosis not present

## 2016-09-02 DIAGNOSIS — N898 Other specified noninflammatory disorders of vagina: Secondary | ICD-10-CM

## 2016-09-02 DIAGNOSIS — Z3A37 37 weeks gestation of pregnancy: Secondary | ICD-10-CM

## 2016-09-02 DIAGNOSIS — O26893 Other specified pregnancy related conditions, third trimester: Secondary | ICD-10-CM

## 2016-09-02 DIAGNOSIS — O99013 Anemia complicating pregnancy, third trimester: Secondary | ICD-10-CM | POA: Diagnosis not present

## 2016-09-02 LAB — POCT WET PREP (WET MOUNT)
Clue Cells Wet Prep Whiff POC: NEGATIVE
TRICHOMONAS WET PREP HPF POC: ABSENT

## 2016-09-02 LAB — POCT URINALYSIS DIPSTICK
Glucose, UA: NEGATIVE
Ketones, UA: NEGATIVE
Nitrite, UA: NEGATIVE

## 2016-09-02 LAB — OB RESULTS CONSOLE GBS: GBS: NEGATIVE

## 2016-09-02 NOTE — Patient Instructions (Signed)
Call the office ([phone]) or go to [facility] if:  You begin to have strong, frequent contractions  Your water breaks.  Sometimes it is a big gush of fluid, sometimes it is just a trickle that keeps getting your panties wet or running down your legs  You have vaginal bleeding.  It is normal to have a small amount of spotting if your cervix was checked.   You don't feel your baby moving like normal.  If you don't, get you something to eat and drink and lay down and focus on feeling your baby move.  You should feel at least 10 movements in 2 hours.  If you don't, you should call the office or go to [facility].     Braxton Hicks Contractions Contractions of the uterus can occur throughout pregnancy, but they are not always a sign that you are in labor. You may have practice contractions called Braxton Hicks contractions. These false labor contractions are sometimes confused with true labor. What are Braxton Hicks contractions? Braxton Hicks contractions are tightening movements that occur in the muscles of the uterus before labor. Unlike true labor contractions, these contractions do not result in opening (dilation) and thinning of the cervix. Toward the end of pregnancy (32-34 weeks), Braxton Hicks contractions can happen more often and may become stronger. These contractions are sometimes difficult to tell apart from true labor because they can be very uncomfortable. You should not feel embarrassed if you go to the hospital with false labor. Sometimes, the only way to tell if you are in true labor is for your health care provider to look for changes in the cervix. The health care provider will do a physical exam and may monitor your contractions. If you are not in true labor, the exam should show that your cervix is not dilating and your water has not broken. If there are no prenatal problems or other health problems associated with your pregnancy, it is completely safe for you to be sent  home with false labor. You may continue to have Braxton Hicks contractions until you go into true labor. How can I tell the difference between true labor and false labor?  Differences ? False labor ? Contractions last 30-70 seconds.: Contractions are usually shorter and not as strong as true labor contractions. ? Contractions become very regular.: Contractions are usually irregular. ? Discomfort is usually felt in the top of the uterus, and it spreads to the lower abdomen and low back.: Contractions are often felt in the front of the lower abdomen and in the groin. ? Contractions do not go away with walking.: Contractions may go away when you walk around or change positions while lying down. ? Contractions usually become more intense and increase in frequency.: Contractions get weaker and are shorter-lasting as time goes on. ? The cervix dilates and gets thinner.: The cervix usually does not dilate or become thin. Follow these instructions at home:  Take over-the-counter and prescription medicines only as told by your health care provider.  Keep up with your usual exercises and follow other instructions from your health care provider.  Eat and drink lightly if you think you are going into labor.  If Braxton Hicks contractions are making you uncomfortable: ? Change your position from lying down or resting to walking, or change from walking to resting. ? Sit and rest in a tub of warm water. ? Drink enough fluid to keep your urine clear or pale yellow. Dehydration may cause these contractions. ?   Do slow and deep breathing several times an hour.  Keep all follow-up prenatal visits as told by your health care provider. This is important. Contact a health care provider if:  You have a fever.  You have continuous pain in your abdomen. Get help right away if:  Your contractions become stronger, more regular, and closer together.  You have fluid leaking or gushing from your vagina.  You  pass blood-tinged mucus (bloody show).  You have bleeding from your vagina.  You have low back pain that you never had before.  You feel your baby's head pushing down and causing pelvic pressure.  Your baby is not moving inside you as much as it used to. Summary  Contractions that occur before labor are called Braxton Hicks contractions, false labor, or practice contractions.  Braxton Hicks contractions are usually shorter, weaker, farther apart, and less regular than true labor contractions. True labor contractions usually become progressively stronger and regular and they become more frequent.  Manage discomfort from Braxton Hicks contractions by changing position, resting in a warm bath, drinking plenty of water, or practicing deep breathing. This information is not intended to replace advice given to you by your health care provider. Make sure you discuss any questions you have with your health care provider. Document Released: 02/08/2005 Document Revised: 12/29/2015 Document Reviewed: 12/29/2015 Elsevier Interactive Patient Education  2017 [employer]  

## 2016-09-02 NOTE — Addendum Note (Signed)
Addended by: Colen DarlingYOUNG, JANET S on: 09/02/2016 02:04 PM   Modules accepted: Orders

## 2016-09-02 NOTE — Progress Notes (Signed)
Low-risk OB appointment [id number] 3553w1d Estimated Date of Delivery: 09/22/16 BP (!) 80/50   Pulse 88   Wt 178 lb (80.7 kg)   LMP 12/17/2015 (Approximate)   BMI 28.73 kg/m   BP, weight, and urine reviewed.  Refer to obstetrical flow sheet for FH & FHR.  Reports good fm.  Denies regular uc's, vb, or uti s/s. Dizzy/lightheaded x 4d. Not much of an appetite- increase protein. Hgb fingerstick 9.5, is taking Fe TID. Did not get Ferralet 4190- [facility] did not cover, so was $70 out of pocket for her. Discussed increasing Fe in diet as well, take Fe w/ OJ.  Underwear has been wet lately, no itching/irritation/odor/abnormal d/c.  SSE: cx floppy multiparous, no pooling, no change w/ valsalva, fern & nitrazine neg, wet prep many wbc's, otherwise neg GBS, gc/ct collected SVE per request: 4/50/-2, vtx Reviewed labor s/s, fkc. Plan:  Continue routine obstetrical care  F/U in 1wk for OB appointment

## 2016-09-04 LAB — GC/CHLAMYDIA PROBE AMP
CHLAMYDIA, DNA PROBE: NEGATIVE
NEISSERIA GONORRHOEAE BY PCR: NEGATIVE

## 2016-09-04 LAB — STREP GP B NAA: Strep Gp B NAA: NEGATIVE

## 2016-09-05 ENCOUNTER — Inpatient Hospital Stay (HOSPITAL_COMMUNITY)
Admission: AD | Admit: 2016-09-05 | Discharge: 2016-09-05 | Disposition: A | Discharge status: Home / Self Care | Payer: Medicaid Other | Source: Ambulatory Visit | Attending: Obstetrics and Gynecology | Admitting: Obstetrics and Gynecology

## 2016-09-05 ENCOUNTER — Encounter (HOSPITAL_COMMUNITY): Payer: Self-pay

## 2016-09-05 DIAGNOSIS — O471 False labor at or after 37 completed weeks of gestation: Secondary | ICD-10-CM | POA: Diagnosis present

## 2016-09-05 DIAGNOSIS — O479 False labor, unspecified: Secondary | ICD-10-CM

## 2016-09-05 DIAGNOSIS — Z3A38 38 weeks gestation of pregnancy: Secondary | ICD-10-CM | POA: Insufficient documentation

## 2016-09-05 DIAGNOSIS — O0932 Supervision of pregnancy with insufficient antenatal care, second trimester: Secondary | ICD-10-CM

## 2016-09-05 DIAGNOSIS — Z3483 Encounter for supervision of other normal pregnancy, third trimester: Secondary | ICD-10-CM

## 2016-09-05 NOTE — MAU Note (Signed)
Pt reports she has been having ctx on and off all day. Denies any vaginal bleeding or SROM. Good fetal movement reported

## 2016-09-05 NOTE — Progress Notes (Signed)
I have communicated with Artelia LarocheM. Williams CNM and Dr Mosetta PuttFeng reviewed vital signs:  Vitals:   09/05/16 0126 09/05/16 0211  BP: 110/61   Pulse: 82 81  Resp: 16 16  Temp:  98.2 F (36.8 C)   temp: 98.2, pulse 81, resp 16, BP 99/56 Vaginal exam:  Dilation: 3.5 Effacement (%): 50 Cervical Position: Posterior Station: -2 Presentation: Vertex Exam by:: Ardean Simonich StanleyRN,   Also reviewed contraction pattern and that non-stress test is reactive.  It has been documented that patient is contracting every 11 minutes with no cervical change since office visit, not indicating active labor.  Patient denies any other complaints.  Pt prefers to go home now instead of waiting another hour to be rechecked because she stated contractions are less often now. Based on this report provider has given order for discharge.  A discharge order and diagnosis entered by a provider.   Labor discharge instructions reviewed with patient.

## 2016-09-08 ENCOUNTER — Inpatient Hospital Stay (HOSPITAL_COMMUNITY): Payer: Medicaid Other | Admitting: Anesthesiology

## 2016-09-08 ENCOUNTER — Encounter (HOSPITAL_COMMUNITY): Payer: Self-pay | Admitting: *Deleted

## 2016-09-08 ENCOUNTER — Encounter (HOSPITAL_COMMUNITY)
Admission: AD | Disposition: A | Discharge status: Home / Self Care | Payer: Self-pay | Source: Ambulatory Visit | Attending: Family Medicine

## 2016-09-08 ENCOUNTER — Inpatient Hospital Stay (HOSPITAL_COMMUNITY)
Admission: AD | Admit: 2016-09-08 | Discharge: 2016-09-09 | DRG: 767 | Disposition: A | Discharge status: Home / Self Care | Payer: Medicaid Other | Source: Ambulatory Visit | Attending: Family Medicine | Admitting: Family Medicine

## 2016-09-08 DIAGNOSIS — O4202 Full-term premature rupture of membranes, onset of labor within 24 hours of rupture: Secondary | ICD-10-CM | POA: Diagnosis present

## 2016-09-08 DIAGNOSIS — F1290 Cannabis use, unspecified, uncomplicated: Secondary | ICD-10-CM | POA: Diagnosis present

## 2016-09-08 DIAGNOSIS — Z87891 Personal history of nicotine dependence: Secondary | ICD-10-CM

## 2016-09-08 DIAGNOSIS — O4290 Premature rupture of membranes, unspecified as to length of time between rupture and onset of labor, unspecified weeks of gestation: Secondary | ICD-10-CM

## 2016-09-08 DIAGNOSIS — O99323 Drug use complicating pregnancy, third trimester: Secondary | ICD-10-CM | POA: Diagnosis present

## 2016-09-08 DIAGNOSIS — Z3483 Encounter for supervision of other normal pregnancy, third trimester: Secondary | ICD-10-CM

## 2016-09-08 DIAGNOSIS — Z302 Encounter for sterilization: Secondary | ICD-10-CM

## 2016-09-08 DIAGNOSIS — O4292 Full-term premature rupture of membranes, unspecified as to length of time between rupture and onset of labor: Secondary | ICD-10-CM | POA: Diagnosis present

## 2016-09-08 DIAGNOSIS — O0932 Supervision of pregnancy with insufficient antenatal care, second trimester: Secondary | ICD-10-CM

## 2016-09-08 DIAGNOSIS — Z3A38 38 weeks gestation of pregnancy: Secondary | ICD-10-CM | POA: Diagnosis not present

## 2016-09-08 HISTORY — PX: TUBAL LIGATION: SHX77

## 2016-09-08 LAB — POCT FERN TEST: POCT FERN TEST: POSITIVE

## 2016-09-08 LAB — CBC
HCT: 26.8 % — ABNORMAL LOW (ref 36.0–46.0)
HEMOGLOBIN: 9 g/dL — AB (ref 12.0–15.0)
MCH: 28.3 pg (ref 26.0–34.0)
MCHC: 33.6 g/dL (ref 30.0–36.0)
MCV: 84.3 fL (ref 78.0–100.0)
PLATELETS: 231 10*3/uL (ref 150–400)
RBC: 3.18 MIL/uL — AB (ref 3.87–5.11)
RDW: 16.1 % — ABNORMAL HIGH (ref 11.5–15.5)
WBC: 12.1 10*3/uL — AB (ref 4.0–10.5)

## 2016-09-08 LAB — TYPE AND SCREEN
ABO/RH(D): O POS
ANTIBODY SCREEN: NEGATIVE

## 2016-09-08 SURGERY — LIGATION, FALLOPIAN TUBE, POSTPARTUM
Anesthesia: Epidural | Site: Abdomen | Laterality: Bilateral | Wound class: Clean Contaminated

## 2016-09-08 MED ORDER — ZOLPIDEM TARTRATE 5 MG PO TABS: 5.0000 mg | ORAL_TABLET | Freq: Every evening | ORAL | Status: DC | PRN

## 2016-09-08 MED ORDER — SIMETHICONE 80 MG PO CHEW: 80.0000 mg | CHEWABLE_TABLET | ORAL | Status: DC | PRN

## 2016-09-08 MED ORDER — LACTATED RINGERS IV SOLN
INTRAVENOUS | Status: DC
  Administered 2016-09-08 (×3): via INTRAVENOUS

## 2016-09-08 MED ORDER — LACTATED RINGERS IV SOLN
500.0000 mL | Freq: Once | INTRAVENOUS | Status: AC
  Administered 2016-09-08: 500 mL via INTRAVENOUS

## 2016-09-08 MED ORDER — BENZOCAINE-MENTHOL 20-0.5 % EX AERO: 1.0000 "application " | INHALATION_SPRAY | CUTANEOUS | Status: DC | PRN

## 2016-09-08 MED ORDER — TERBUTALINE SULFATE 1 MG/ML IJ SOLN: 0.2500 mg | Freq: Once | INTRAMUSCULAR | Status: DC | PRN

## 2016-09-08 MED ORDER — PHENYLEPHRINE 40 MCG/ML (10ML) SYRINGE FOR IV PUSH (FOR BLOOD PRESSURE SUPPORT): 80.0000 ug | PREFILLED_SYRINGE | INTRAVENOUS | Status: DC | PRN

## 2016-09-08 MED ORDER — OXYTOCIN 40 UNITS IN LACTATED RINGERS INFUSION - SIMPLE MED
1.0000 m[IU]/min | INTRAVENOUS | Status: DC
  Administered 2016-09-08: 14 m[IU]/min via INTRAVENOUS
  Administered 2016-09-08: 2 m[IU]/min via INTRAVENOUS

## 2016-09-08 MED ORDER — LIDOCAINE-EPINEPHRINE (PF) 2 %-1:200000 IJ SOLN
INTRAMUSCULAR | Status: DC | PRN
  Administered 2016-09-08 (×3): 5 mL via EPIDURAL

## 2016-09-08 MED ORDER — BUPIVACAINE HCL (PF) 0.25 % IJ SOLN
INTRAMUSCULAR | Status: AC
  Filled 2016-09-08: qty 10

## 2016-09-08 MED ORDER — TETANUS-DIPHTH-ACELL PERTUSSIS 5-2.5-18.5 LF-MCG/0.5 IM SUSP: 0.5000 mL | Freq: Once | INTRAMUSCULAR | Status: DC

## 2016-09-08 MED ORDER — OXYTOCIN 40 UNITS IN LACTATED RINGERS INFUSION - SIMPLE MED
2.5000 [IU]/h | INTRAVENOUS | Status: DC
  Filled 2016-09-08: qty 1000

## 2016-09-08 MED ORDER — BUPIVACAINE HCL (PF) 0.25 % IJ SOLN
INTRAMUSCULAR | Status: DC | PRN
  Administered 2016-09-08: 10 mL

## 2016-09-08 MED ORDER — LIDOCAINE HCL (PF) 1 % IJ SOLN
INTRAMUSCULAR | Status: DC | PRN
  Administered 2016-09-08 (×2): 6 mL via EPIDURAL

## 2016-09-08 MED ORDER — ONDANSETRON HCL 4 MG PO TABS: 4.0000 mg | ORAL_TABLET | ORAL | Status: DC | PRN

## 2016-09-08 MED ORDER — DIPHENHYDRAMINE HCL 25 MG PO CAPS: 25.0000 mg | ORAL_CAPSULE | Freq: Four times a day (QID) | ORAL | Status: DC | PRN

## 2016-09-08 MED ORDER — EPHEDRINE 5 MG/ML INJ: 10.0000 mg | INTRAVENOUS | Status: DC | PRN

## 2016-09-08 MED ORDER — DIBUCAINE 1 % RE OINT: 1.0000 "application " | TOPICAL_OINTMENT | RECTAL | Status: DC | PRN

## 2016-09-08 MED ORDER — PROMETHAZINE HCL 25 MG/ML IJ SOLN: 6.2500 mg | INTRAMUSCULAR | Status: DC | PRN

## 2016-09-08 MED ORDER — FENTANYL CITRATE (PF) 100 MCG/2ML IJ SOLN: 50.0000 ug | INTRAMUSCULAR | Status: DC | PRN

## 2016-09-08 MED ORDER — ONDANSETRON HCL 4 MG/2ML IJ SOLN: 4.0000 mg | INTRAMUSCULAR | Status: DC | PRN

## 2016-09-08 MED ORDER — DIPHENHYDRAMINE HCL 50 MG/ML IJ SOLN: 12.5000 mg | INTRAMUSCULAR | Status: DC | PRN

## 2016-09-08 MED ORDER — SODIUM CHLORIDE 0.9 % IR SOLN
Status: DC | PRN
  Administered 2016-09-08: 1

## 2016-09-08 MED ORDER — ACETAMINOPHEN 325 MG PO TABS: 650.0000 mg | ORAL_TABLET | ORAL | Status: DC | PRN

## 2016-09-08 MED ORDER — SOD CITRATE-CITRIC ACID 500-334 MG/5ML PO SOLN
30.0000 mL | ORAL | Status: DC | PRN
  Administered 2016-09-08: 30 mL via ORAL
  Filled 2016-09-08: qty 15

## 2016-09-08 MED ORDER — IBUPROFEN 600 MG PO TABS
600.0000 mg | ORAL_TABLET | Freq: Four times a day (QID) | ORAL | Status: DC
  Administered 2016-09-08 – 2016-09-09 (×4): 600 mg via ORAL
  Filled 2016-09-08 (×4): qty 1

## 2016-09-08 MED ORDER — LACTATED RINGERS IV SOLN
500.0000 mL | INTRAVENOUS | Status: DC | PRN
  Administered 2016-09-08: 500 mL via INTRAVENOUS

## 2016-09-08 MED ORDER — OXYCODONE-ACETAMINOPHEN 5-325 MG PO TABS: 2.0000 | ORAL_TABLET | ORAL | Status: DC | PRN

## 2016-09-08 MED ORDER — MIDAZOLAM HCL 2 MG/2ML IJ SOLN
INTRAMUSCULAR | Status: DC | PRN
  Administered 2016-09-08: 2 mg via INTRAVENOUS

## 2016-09-08 MED ORDER — FENTANYL CITRATE (PF) 100 MCG/2ML IJ SOLN
INTRAMUSCULAR | Status: AC
  Filled 2016-09-08: qty 2

## 2016-09-08 MED ORDER — LACTATED RINGERS IV SOLN: 500.0000 mL | Freq: Once | INTRAVENOUS | Status: DC

## 2016-09-08 MED ORDER — LIDOCAINE HCL (PF) 1 % IJ SOLN
30.0000 mL | INTRAMUSCULAR | Status: DC | PRN
  Filled 2016-09-08: qty 30

## 2016-09-08 MED ORDER — HYDROMORPHONE HCL 1 MG/ML IJ SOLN
INTRAMUSCULAR | Status: AC
  Filled 2016-09-08: qty 0.5

## 2016-09-08 MED ORDER — WITCH HAZEL-GLYCERIN EX PADS: 1.0000 "application " | MEDICATED_PAD | CUTANEOUS | Status: DC | PRN

## 2016-09-08 MED ORDER — COCONUT OIL OIL: 1.0000 "application " | TOPICAL_OIL | Status: DC | PRN

## 2016-09-08 MED ORDER — MEPERIDINE HCL 25 MG/ML IJ SOLN: 6.2500 mg | INTRAMUSCULAR | Status: DC | PRN

## 2016-09-08 MED ORDER — OXYCODONE-ACETAMINOPHEN 5-325 MG PO TABS: 1.0000 | ORAL_TABLET | ORAL | Status: DC | PRN

## 2016-09-08 MED ORDER — ONDANSETRON HCL 4 MG/2ML IJ SOLN
INTRAMUSCULAR | Status: AC
  Filled 2016-09-08: qty 2

## 2016-09-08 MED ORDER — KETOROLAC TROMETHAMINE 30 MG/ML IJ SOLN: 30.0000 mg | Freq: Once | INTRAMUSCULAR | Status: DC | PRN

## 2016-09-08 MED ORDER — SENNOSIDES-DOCUSATE SODIUM 8.6-50 MG PO TABS
2.0000 | ORAL_TABLET | ORAL | Status: DC
  Administered 2016-09-08: 2 via ORAL
  Filled 2016-09-08: qty 2

## 2016-09-08 MED ORDER — FLEET ENEMA 7-19 GM/118ML RE ENEM: 1.0000 | ENEMA | RECTAL | Status: DC | PRN

## 2016-09-08 MED ORDER — BUPIVACAINE HCL (PF) 0.25 % IJ SOLN
INTRAMUSCULAR | Status: AC
  Filled 2016-09-08: qty 20

## 2016-09-08 MED ORDER — MIDAZOLAM HCL 2 MG/2ML IJ SOLN
INTRAMUSCULAR | Status: AC
  Filled 2016-09-08: qty 2

## 2016-09-08 MED ORDER — OXYTOCIN BOLUS FROM INFUSION
500.0000 mL | Freq: Once | INTRAVENOUS | Status: AC
  Administered 2016-09-08: 500 mL via INTRAVENOUS

## 2016-09-08 MED ORDER — ONDANSETRON HCL 4 MG/2ML IJ SOLN: 4.0000 mg | Freq: Four times a day (QID) | INTRAMUSCULAR | Status: DC | PRN

## 2016-09-08 MED ORDER — PRENATAL MULTIVITAMIN CH
1.0000 | ORAL_TABLET | Freq: Every day | ORAL | Status: DC
  Administered 2016-09-09: 1 via ORAL
  Filled 2016-09-08: qty 1

## 2016-09-08 MED ORDER — HYDROXYZINE HCL 50 MG PO TABS: 50.0000 mg | ORAL_TABLET | Freq: Four times a day (QID) | ORAL | Status: DC | PRN

## 2016-09-08 MED ORDER — PHENYLEPHRINE 40 MCG/ML (10ML) SYRINGE FOR IV PUSH (FOR BLOOD PRESSURE SUPPORT)
80.0000 ug | PREFILLED_SYRINGE | INTRAVENOUS | Status: DC | PRN
  Filled 2016-09-08: qty 10

## 2016-09-08 MED ORDER — FENTANYL 2.5 MCG/ML BUPIVACAINE 1/10 % EPIDURAL INFUSION (WH - ANES)
14.0000 mL/h | INTRAMUSCULAR | Status: DC | PRN
  Administered 2016-09-08: 14 mL/h via EPIDURAL
  Filled 2016-09-08: qty 100

## 2016-09-08 MED ORDER — LIDOCAINE-EPINEPHRINE (PF) 2 %-1:200000 IJ SOLN: INTRAMUSCULAR | Status: DC | PRN

## 2016-09-08 MED ORDER — HYDROMORPHONE HCL 1 MG/ML IJ SOLN
0.2500 mg | INTRAMUSCULAR | Status: DC | PRN
  Administered 2016-09-08: 0.5 mg via INTRAVENOUS

## 2016-09-08 MED ORDER — ONDANSETRON HCL 4 MG/2ML IJ SOLN
INTRAMUSCULAR | Status: DC | PRN
  Administered 2016-09-08: 4 mg via INTRAVENOUS

## 2016-09-08 MED ORDER — LACTATED RINGERS IV SOLN
INTRAVENOUS | Status: DC | PRN
  Administered 2016-09-08: 14:00:00 via INTRAVENOUS

## 2016-09-08 SURGICAL SUPPLY — 24 items
BLADE SURG 11 STRL SS (BLADE) ×3 IMPLANT
CLIP FILSHIE TUBAL LIGA STRL (Clip) ×3 IMPLANT
CLOSURE WOUND 1/4X4 (GAUZE/BANDAGES/DRESSINGS) ×1
CLOTH BEACON ORANGE TIMEOUT ST (SAFETY) ×3 IMPLANT
DRSG OPSITE POSTOP 3X4 (GAUZE/BANDAGES/DRESSINGS) ×3 IMPLANT
DURAPREP 26ML APPLICATOR (WOUND CARE) ×3 IMPLANT
GLOVE BIOGEL PI IND STRL 7.0 (GLOVE) ×4 IMPLANT
GLOVE BIOGEL PI IND STRL 7.5 (GLOVE) ×1 IMPLANT
GLOVE BIOGEL PI INDICATOR 7.0 (GLOVE) ×8
GLOVE BIOGEL PI INDICATOR 7.5 (GLOVE) ×2
GLOVE ECLIPSE 7.5 STRL STRAW (GLOVE) ×9 IMPLANT
GOWN STRL REUS W/TWL LRG LVL3 (GOWN DISPOSABLE) ×12 IMPLANT
NEEDLE HYPO 22GX1.5 SAFETY (NEEDLE) ×3 IMPLANT
NS IRRIG 1000ML POUR BTL (IV SOLUTION) ×3 IMPLANT
PACK ABDOMINAL MINOR (CUSTOM PROCEDURE TRAY) ×3 IMPLANT
PROTECTOR NERVE ULNAR (MISCELLANEOUS) IMPLANT
SPONGE LAP 4X18 X RAY DECT (DISPOSABLE) ×3 IMPLANT
STRIP CLOSURE SKIN 1/4X4 (GAUZE/BANDAGES/DRESSINGS) ×2 IMPLANT
SUT VICRYL 0 UR6 27IN ABS (SUTURE) ×3 IMPLANT
SUT VICRYL 4-0 PS2 18IN ABS (SUTURE) ×3 IMPLANT
SYR CONTROL 10ML LL (SYRINGE) ×3 IMPLANT
TOWEL OR 17X24 6PK STRL BLUE (TOWEL DISPOSABLE) ×6 IMPLANT
TRAY FOLEY CATH SILVER 14FR (SET/KITS/TRAYS/PACK) ×3 IMPLANT
WATER STERILE IRR 1000ML POUR (IV SOLUTION) IMPLANT

## 2016-09-08 NOTE — MAU Note (Signed)
Pt noticed a gush of fluid around 0015.  Clear Fluid, denies VB/pain.

## 2016-09-08 NOTE — Progress Notes (Signed)
Bedside US: VERTEX  Cassandra ShellerHeather Ezzie Berg  2:06 AM 09/08/16

## 2016-09-08 NOTE — Anesthesia Postprocedure Evaluation (Signed)
Anesthesia Post Note  Patient: Cassandra Berg  Procedure(s) Performed: Procedure(s) (LRB): POST PARTUM TUBAL LIGATION (Bilateral)     Patient location during evaluation: Mother Baby Anesthesia Type: Epidural Level of consciousness: awake and alert and oriented Pain management: pain level controlled Vital Signs Assessment: post-procedure vital signs reviewed and stable Respiratory status: spontaneous breathing and nonlabored ventilation Cardiovascular status: stable Postop Assessment: no headache, patient able to bend at knees, no backache, no signs of nausea or vomiting, epidural receding and adequate PO intake Anesthetic complications: no    Last Vitals:  Vitals:   09/08/16 1803 09/08/16 2045  BP: (!) 108/52 107/61  Pulse: 75 66  Resp: 18 18  Temp:  36.8 C    Last Pain:  Vitals:   09/08/16 2045  TempSrc: Oral  PainSc: 3    Pain Goal: Patients Stated Pain Goal: 4 (09/08/16 1653)               Donnalee CurryMalinova,Vishwa Dais Hristova

## 2016-09-08 NOTE — Lactation Note (Signed)
This note was copied from a baby's chart. Lactation Consultation Note  Patient Name: Cassandra Berg ZOXWR'UToday's Date: 09/08/2016 Reason for consult: Initial assessment Baby at 10 hr of life. Mom requested help latching baby. Mom stated baby "gets on but I know its not far enough". Showed mom how to latch baby in cross cradle to the L breast. Mom denies breast or nipple pain. Discussed baby behavior, feeding frequency, baby belly size, voids, wt loss, breast changes, and nipple care. Demonstrated manual expression, colostrum noted bilaterally, spoon in room. Given lactation handouts. Aware of OP services and support group.      Maternal Data Has patient been taught Hand Expression?: Yes Does the patient have breastfeeding experience prior to this delivery?: Yes  Feeding Feeding Type: Breast Fed Length of feed: 20 min  LATCH Score/Interventions Latch: Grasps breast easily, tongue down, lips flanged, rhythmical sucking.  Audible Swallowing: A few with stimulation Intervention(s): Skin to skin;Hand expression  Type of Nipple: Everted at rest and after stimulation  Comfort (Breast/Nipple): Soft / non-tender     Hold (Positioning): Assistance needed to correctly position infant at breast and maintain latch. Intervention(s): Position options;Support Pillows  LATCH Score: 8  Lactation Tools Discussed/Used     Consult Status Consult Status: Follow-up Date: 09/09/16 Follow-up type: In-patient    [name] 09/08/2016, 10:17 PM

## 2016-09-08 NOTE — Progress Notes (Signed)
Patient ID: Cassandra RuddleLashanda M Berg, female   DOB: Jul 23, 1990, 26 y.o.   MRN: [id number] Cassandra Berg is a 26 y.o. G3P2002 at 7186w0d admitted for PROM  Subjective: Doing well, some pressure w/ uc's, has been sleeping, hasn't received any pain meds, etc  Objective: BP 103/64   Pulse 79   Temp 98.1 F (36.7 C) (Axillary)   Resp 16   Ht 5\' 6"  (1.676 m)   Wt 80.7 kg (178 lb)   LMP 12/17/2015 (Approximate)   BMI 28.73 kg/m  No intake/output data recorded.  FHT:  FHR: 120 bpm, variability: moderate,  accelerations:  Present,  decelerations:  Absent UC:   regular, every 2 minutes  SVE:   Dilation: 4 Effacement (%): 50 Station: -2 Exam by:: Joellyn HaffKim Bryker Fletchall, CNM   AROM forebag, clear fluid  Pitocin @ 14 mu/min  Labs: Lab Results  Component Value Date   WBC 12.1 (H) 09/08/2016   HGB 9.0 (L) 09/08/2016   HCT 26.8 (L) 09/08/2016   MCV 84.3 09/08/2016   PLT 231 09/08/2016    Assessment / Plan: IOL d/t prom, pit @ 14mu, arom'd forebag, cx unchanged  Labor: early Fetal Wellbeing:  Category I Pain Control:  Labor support without medications Pre-eclampsia: n/a I/D:  neg Anticipated MOD:  NSVD  Marge DuncansBooker, Brittain Hosie Randall CNM, WHNP-BC 09/08/2016, 7:05 AM

## 2016-09-08 NOTE — Anesthesia Pain Management Evaluation Note (Signed)
  CRNA Pain Management Visit Note  Patient: Cassandra Berg, 26 y.o., female  "[facility] We have an anesthesia team available at all times to provide care throughout the hospital, including epidural management and anesthesia for C-section. I don't know your plan for the delivery whether it a natural birth, water birth, IV sedation, nitrous supplementation, doula or epidural, but we want to meet your pain goals."   1.Was your pain managed to your expectations on prior hospitalizations?   Yes   2.What is your expectation for pain management during this hospitalization?     Epidural and IV pain meds  3.How can we help you reach that goal? Be available  Record the patient's initial score and the patient's pain goal.   Pain: 0  Pain Goal: 5 [facility] wants you to be able to say your pain was always managed very well.  [facility] Endoscopy CenterMERRITT,Lanissa Cashen 09/08/2016

## 2016-09-08 NOTE — Transfer of Care (Signed)
Immediate Anesthesia Transfer of Care Note  Patient: Cassandra Berg  Procedure(s) Performed: Procedure(s): POST PARTUM TUBAL LIGATION (Bilateral)  Patient Location: PACU  Anesthesia Type:Epidural  Level of Consciousness: sedated  Airway & Oxygen Therapy: Patient Spontanous Breathing  Post-op Assessment: Report given to RN  Post vital signs: Reviewed and stable  Last Vitals:  Vitals:   09/08/16 1302 09/08/16 1324  BP: 116/66 100/67  Pulse: 89 74  Resp: 19 16  Temp:      Last Pain:  Vitals:   09/08/16 1232  TempSrc:   PainSc: 0-No pain      Patients Stated Pain Goal: 8 (09/08/16 0726)  Complications: No apparent anesthesia complications

## 2016-09-08 NOTE — Anesthesia Preprocedure Evaluation (Signed)
Anesthesia Evaluation  Patient identified by MRN, date of birth, ID band Patient awake    Reviewed: Allergy & Precautions, NPO status , Patient's Chart, lab work & pertinent test results  History of Anesthesia Complications Negative for: history of anesthetic complications  Airway Mallampati: I  TM Distance: >3 FB Neck ROM: Full    Dental  (+) Dental Advisory Given   Pulmonary former smoker,    breath sounds clear to auscultation       Cardiovascular negative cardio ROS   Rhythm:Regular Rate:Normal     Neuro/Psych negative neurological ROS     GI/Hepatic negative GI ROS, Neg liver ROS,   Endo/Other  negative endocrine ROS  Renal/GU negative Renal ROS     Musculoskeletal   Abdominal   Peds  Hematology  (+) Blood dyscrasia (Hb 9.0), anemia , plt 231k   Anesthesia Other Findings   Reproductive/Obstetrics (+) Pregnancy                             Anesthesia Physical Anesthesia Plan  ASA: II  Anesthesia Plan: Epidural   Post-op Pain Management:    Induction:   PONV Risk Score and Plan: 2 and Treatment may vary due to age or medical condition  Airway Management Planned: Natural Airway  Additional Equipment:   Intra-op Plan:   Post-operative Plan:   Informed Consent: I have reviewed the patients History and Physical, chart, labs and discussed the procedure including the risks, benefits and alternatives for the proposed anesthesia with the patient or authorized representative who has indicated his/her understanding and acceptance.   Dental advisory given  Plan Discussed with:   Anesthesia Plan Comments: (Patient identified. Risks/Benefits/Options discussed with patient including but not limited to bleeding, infection, nerve damage, paralysis, failed block, incomplete pain control, headache, blood pressure changes, nausea, vomiting, reactions to medication both or allergic,  itching and postpartum back pain. Confirmed with bedside nurse the patient's most recent platelet count. Confirmed with patient that they are not currently taking any anticoagulation, have any bleeding history or any family history of bleeding disorders. Patient expressed understanding and wished to proceed. All questions were answered. )        Anesthesia Quick Evaluation

## 2016-09-08 NOTE — Op Note (Signed)
Georg RuddleLashanda M Raptis 09/08/2016  PREOPERATIVE DIAGNOSIS:  Multiparity, undesired fertility  POSTOPERATIVE DIAGNOSIS:  Multiparity, undesired fertility  PROCEDURE:  Postpartum Bilateral Tubal Sterilization using Filshie Clips   ANESTHESIA:  Epidural  COMPLICATIONS:  None immediate.  ESTIMATED BLOOD LOSS:  5 ml.  INDICATIONS: 26 y.o. [id number]  with undesired fertility,status post vaginal delivery, desires permanent sterilization. Risks and benefits of procedure discussed with patient including permanence of method, bleeding, infection, injury to surrounding organs and need for additional procedures. Risk failure of 0.5-1% with increased risk of ectopic gestation if pregnancy occurs was also discussed with patient.   FINDINGS:  Normal uterus, tubes, and ovaries.  TECHNIQUE:  The patient was taken to the operating room where her epidural anesthesia was dosed up to surgical level and found to be adequate.  She was then placed in the dorsal supine position and prepped and draped in sterile fashion.  After an adequate timeout was performed, attention was turned to the patient's abdomen where a small transverse skin incision was made under the umbilical fold. The incision was taken down to the layer of fascia using the scalpel, and fascia was incised, and extended bilaterally using Mayo scissors. The peritoneum was entered in a sharp fashion. Attention was then turned to the patient's uterus, and left fallopian tube was identified and followed out to the fimbriated end.  A Filshie clip was placed on the left fallopian tube about 2 cm from the cornual attachment, with care given to incorporate the underlying mesosalpinx.  A similar process was carried out on the rightl side allowing for bilateral tubal sterilization.  Good hemostasis was noted overall.  The instruments were then removed from the patient's abdomen and the fascial incision was repaired with 0 Vicryl, and the skin was closed with a 3-0 Monocryl  subcuticular stitch. The patient tolerated the procedure well.  Sponge, lap, and needle counts were correct times two.  The patient was then taken to the recovery room awake, extubated and in stable condition.

## 2016-09-08 NOTE — H&P (Signed)
LABOR ADMISSION HISTORY AND PHYSICAL  Cassandra Berg is a 26 y.o. female G3P2002 with IUP at 2223w0d by LMP presenting for SROM @ 1215. Says she was told during her second pregnancy that she had a shoulder dystocia during her first pregnancy but she was unaware at the time. She reports +FMs, LOF, no VB, no blurry vision, no headaches, no peripheral edema, and no RUQ pain.  She plans on breast and bottle feeding. She requests BTL or IUD for birth control.  Dating: By LMP --->  Estimated Date of Delivery: 09/22/16   Prenatal History/Complications:  Past Medical History: Past Medical History:  Diagnosis Date  . Anemia   . Vaginal Pap smear, abnormal     Past Surgical History: Past Surgical History:  Procedure Laterality Date  . WISDOM TOOTH EXTRACTION      Obstetrical History: OB History    Gravida Para Term Preterm AB Living   3 2 2     2    SAB TAB Ectopic Multiple Live Births         0 2      Social History: Social History   Social History  . Marital status: Single    Spouse name: N/A  . Number of children: N/A  . Years of education: N/A   Social History Main Topics  . Smoking status: Former Smoker    Packs/day: 0.25    Types: Cigarettes  . Smokeless tobacco: Never Used  . Alcohol use No     Comment: socially  . Drug use: No  . Sexual activity: Yes    Birth control/ protection: None   Other Topics Concern  . None   Social History Narrative  . None    Family History: Family History  Problem Relation Age of Onset  . Heart disease Mother   . Stroke Mother   . Hypertension Mother   . Diabetes Father   . Hyperlipidemia Father   . Cancer Maternal Grandmother        lung  . Diabetes Paternal Grandmother     Allergies: No Known Allergies  Prescriptions Prior to Admission  Medication Sig Dispense Refill Last Dose  . ferrous sulfate 325 (65 FE) MG tablet Take 325 mg by mouth daily with breakfast.   09/04/2016 at Unknown time  . Prenat w/o  A-[id number]-[id number] (CITRANATAL ASSURE) 35-1 & 300 MG tablet One tablet and one capsule daily 60 tablet 11 09/04/2016 at Unknown time     Review of Systems   All systems reviewed and negative except as stated in HPI  Blood pressure 106/66, pulse 91, temperature 97.8 F (36.6 C), temperature source Oral, resp. rate 16, last menstrual period 12/17/2015, not currently breastfeeding. General appearance: alert and cooperative Extremities: Homans sign is negative, no sign of DVT Presentation: cephalic Fetal monitoringBaseline: 140 bpm, Variability: Good {> 6 bpm), Accelerations: Reactive and Decelerations: Absent Uterine activityFrequency: Every 3-6 minutes     Prenatal labs: ABO, Rh: O/Positive/-- (04/26 1115) Antibody: Negative (05/17 0942) Rubella: Immune RPR: Non Reactive (05/17 0942)  HBsAg: Negative (04/26 1115)  HIV: Non Reactive (05/17 0942)  GBS: Negative (07/12 1600)  1 hr Glucola 78  Prenatal Transfer Tool  Maternal Diabetes: No Genetic Screening: Declined Maternal Ultrasounds/Referrals: Normal Fetal Ultrasounds or other Referrals:  None Maternal Substance Abuse:  Yes:  Type: Marijuana Significant Maternal Medications:  None Significant Maternal Lab Results: Lab values include: Group B Strep negative  Results for orders placed or performed during the hospital encounter of 09/08/16 (from the  past 24 hour(s))  POCT fern test   Collection Time: 09/08/16  2:23 AM  Result Value Ref Range   POCT Fern Test Positive = ruptured amniotic membanes     Patient Active Problem List   Diagnosis Date Noted  . Chlamydia infection complicating pregnancy, second trimester 06/21/2016  . Anemia in pregnancy 06/18/2016  . Supervision of normal pregnancy 06/17/2016  . Late prenatal care affecting pregnancy in second trimester 06/17/2016  . Short interval between pregnancies affecting pregnancy, antepartum 06/17/2016  . 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 History of shoulder dystocia in prior  pregnancy 02/26/2015    Assessment: Cassandra Berg is a 26 y.o. G3P2002 at [redacted]w[redacted]d here for SROM @ 1215 with mod amount of clear fluid with hx of shoulder dystocia with first baby.  #Labor: Expectant Management #Pain: Epidural upon request  #FWB: Cat 1 #ID: GBS neg #MOF: breast and bottle #MOC:IUD vs BTL- consent 06/17/16 #Circ:  N/A  Cassandra Shirley, DO Family Medicine Resident PGY-1  09/08/2016, 2:41 AM    I spoke with and examined patient and agree with resident/PA/SNM's note and plan of care. Mild shoulder dystocia 1st baby- resolved w/ rotation of shoulders, baby weighed 7lb15oz, had subsequent uncomplicated SVB of 9lb2oz baby w/o dystocia. This pregnancy complicated by later care @ 26wks, THC use, short interval pregnancy- last baby born 09/13/15, anemia, and chlamydia w/ neg poc.  SROM clear fluid was at 09/08/16 @ 0015, not 1215 as documented.  Cheral Marker, CNM, [facility] 09/08/2016 3:41 AM

## 2016-09-08 NOTE — Anesthesia Procedure Notes (Signed)
Epidural Patient location during procedure: OB Start time: 09/08/2016 7:55 AM End time: 09/08/2016 8:14 AM  Staffing Anesthesiologist: Jairo BenJACKSON, Sophia Cubero Performed: anesthesiologist   Preanesthetic Checklist Completed: patient identified, surgical consent, pre-op evaluation, timeout performed, IV checked, risks and benefits discussed and monitors and equipment checked  Epidural Patient position: sitting Prep: site prepped and draped and DuraPrep Patient monitoring: blood pressure, continuous pulse ox and heart rate Approach: midline Location: L3-L4 Injection technique: LOR air  Needle:  Needle type: Tuohy  Needle gauge: 17 G Needle length: 9 cm Needle insertion depth: 4 cm Catheter type: closed end flexible Catheter size: 19 Gauge Catheter at skin depth: 9.5 cm Test dose: negative (1% lidocaine)  Assessment Events: blood not aspirated, injection not painful, no injection resistance, negative IV test and no paresthesia  Additional Notes Pt identified in Labor room.  Monitors applied. Working IV access confirmed. Sterile prep, drape lumbar spine.  1% lido local L 3,4.  #17ga Touhy LOR air at 4 cm L 3,4, cath in easily to 9.5 cm skin. Test dose OK, cath dosed and infusion begun.  Patient asymptomatic, VSS, no heme aspirated, tolerated well.  Sandford Craze Syrena Burges, MDReason for block:procedure for pain

## 2016-09-09 ENCOUNTER — Encounter (HOSPITAL_COMMUNITY): Payer: Self-pay | Admitting: Family Medicine

## 2016-09-09 ENCOUNTER — Encounter (HOSPITAL_COMMUNITY): Payer: Self-pay

## 2016-09-09 ENCOUNTER — Encounter: Payer: Medicaid Other | Admitting: Advanced Practice Midwife

## 2016-09-09 DIAGNOSIS — Z302 Encounter for sterilization: Secondary | ICD-10-CM

## 2016-09-09 LAB — RPR: RPR Ser Ql: NONREACTIVE

## 2016-09-09 MED ORDER — DOCUSATE SODIUM 100 MG PO CAPS: 100.0000 mg | ORAL_CAPSULE | Freq: Two times a day (BID) | ORAL | 0 refills | Status: DC

## 2016-09-09 MED ORDER — OXYCODONE-ACETAMINOPHEN 5-325 MG PO TABS: 1.0000 | ORAL_TABLET | ORAL | 0 refills | Status: AC | PRN

## 2016-09-09 MED ORDER — IBUPROFEN 600 MG PO TABS: 600.0000 mg | ORAL_TABLET | Freq: Four times a day (QID) | ORAL | 0 refills | Status: DC

## 2016-09-09 NOTE — Progress Notes (Signed)
CSW received consult for hx of marijuana use.  Referral was screened out due to the following: °~MOB had no documented substance use after initial prenatal visit/+UPT. °~MOB had no positive drug screens after initial prenatal visit/+UPT. °~Baby's UDS is negative. ° °Please consult CSW if current concerns arise or by MOB's request. ° °CSW will monitor CDS results and make report to Child Protective Services if warranted. ° °Graeme Menees Boyd-Gilyard, MSW, LCSW °Clinical Social Work °[phone] ° °

## 2016-09-09 NOTE — Progress Notes (Signed)
Post Partum Day 1 Subjective: no complaints, up ad lib, voiding, tolerating PO and has not had a bowel movement or flatus- does not wish to have anything yet to help her have a bowel movement She also has some pain at her incisional site.  Objective: Blood pressure (!) 109/51, pulse 68, temperature 98.5 F (36.9 C), temperature source Oral, resp. rate 18, height 5\' 6"  (1.676 m), weight 80.7 kg (178 lb), last menstrual period 12/17/2015, SpO2 98 %, unknown if currently breastfeeding.  Physical Exam:  General: alert, cooperative and no distress Lochia: appropriate Uterine Fundus: firm Incision: healing well DVT Evaluation: No evidence of DVT seen on physical exam. Negative Homan's sign. No significant calf/ankle edema.   Recent Labs  09/08/16 0210  HGB 9.0*  HCT 26.8*    Assessment/Plan: Plan for discharge tomorrow and Breastfeeding and Bottle feeding.    LOS: 1 day   [place] Shirley 09/09/2016, 6:35 AM   OB FELLOW POSTPARTUM PROGRESS NOTE ATTESTATION  I have seen and examined this patient and agree with above documentation in the resident's note. Patient would like to go home today, OK for d/c if baby able to be d/c'd.   Jen MowElizabeth Hadessah Grennan, DO OB Fellow 2:15 PM

## 2016-09-09 NOTE — Discharge Instructions (Signed)
Postpartum Tubal Ligation, Care After Refer to this sheet in the next few weeks. These instructions provide you with information about caring for yourself after your procedure. Your health care provider may also give you more specific instructions. Your treatment has been planned according to current medical practices, but problems sometimes occur. Call your health care provider if you have any problems or questions after your procedure. What can I expect after the procedure? After the procedure, it is common to have:  A sore throat.  Bruising or pain in your back.  Nausea or vomiting.  Dizziness.  Mild abdominal discomfort or pain, such as cramping, gas pain, or feeling bloated.  Soreness where the incision was made.  Tiredness.  Pain in your shoulders.  Follow these instructions at home: Medicines  Take over-the-counter and prescription medicines only as told by your health care provider.  Do not take aspirin because it can cause bleeding.  Do not drive or operate heavy machinery while taking prescription pain medicine. Activity  Rest for the rest of the day.  Gradually return to your normal activities over the next few days.  Do not have sex, douche, or put a tampon or anything else in your vagina for 6 weeks or as long as told by your health care provider.  Do not lift anything that is heavier than your baby for 2 weeks or as long as told by your health care provider. Incision care  Follow instructions from your health care provider about how to take care of your incision. Make sure you: ? Wash your hands with soap and water before you change your bandage (dressing). If soap and water are not available, use hand sanitizer. ? Change your dressing as told by your health care provider. ? Leave stitches (sutures) in place. They may need to stay in place for 2 weeks or longer.  Check your incision area every day for signs of infection. Check for: ? More redness, swelling,  or pain. ? More fluid or blood. ? Warmth. ? Pus or a bad smell. Other Instructions  Do not take baths, swim, or use a hot tub until your health care provider approves. You may take showers.  Keep all follow-up visits as told by your health care provider. This is important. Contact a health care provider if:  You have more redness, swelling, or pain around your incision.  Your incision feels warm to the touch.  You have pus or a bad smell coming from your incision.  The edges of your incision break open after the sutures have been removed.  Your pain does not improve after 2-3 days.  You have a rash.  You repeatedly become dizzy or lightheaded.  Your pain medicine is not helping.  You are constipated. Get help right away if:  You have a fever.  You faint.  You have pain in your abdomen that gets worse.  You have fluid or blood coming from your sutures.  You have shortness of breath or difficulty breathing.  You have chest pain or leg pain.  You have ongoing nausea or diarrhea. This information is not intended to replace advice given to you by your health care provider. Make sure you discuss any questions you have with your health care provider. Document Released: 08/10/2011 Document Revised: 07/14/2015 Document Reviewed: 01/19/2015 Elsevier Interactive Patient Education  2018 [employer]    Vaginal Delivery, Care After Refer to this sheet in the next few weeks. These instructions provide you with information about caring  for yourself after vaginal delivery. Your health care provider may also give you more specific instructions. Your treatment has been planned according to current medical practices, but problems sometimes occur. Call your health care provider if you have any problems or questions. What can I expect after the procedure? After vaginal delivery, it is common to have:  Some bleeding from your vagina.  Soreness in your abdomen, your vagina, and the  area of skin between your vaginal opening and your anus (perineum).  Pelvic cramps.  Fatigue.  Follow these instructions at home: Medicines  Take over-the-counter and prescription medicines only as told by your health care provider.  If you were prescribed an antibiotic medicine, take it as told by your health care provider. Do not stop taking the antibiotic until it is finished. Driving   Do not drive or operate heavy machinery while taking prescription pain medicine.  Do not drive for 24 hours if you received a sedative. Lifestyle  Do not drink alcohol. This is especially important if you are breastfeeding or taking medicine to relieve pain.  Do not use tobacco products, including cigarettes, chewing tobacco, or e-cigarettes. If you need help quitting, ask your health care provider. Eating and drinking  Drink at least 8 eight-ounce glasses of water every day unless you are told not to by your health care provider. If you choose to breastfeed your baby, you may need to drink more water than this.  Eat high-fiber foods every day. These foods may help prevent or relieve constipation. High-fiber foods include: ? Whole grain cereals and breads. ? Brown rice. ? Beans. ? Fresh fruits and vegetables. Activity  Return to your normal activities as told by your health care provider. Ask your health care provider what activities are safe for you.  Rest as much as possible. Try to rest or take a nap when your baby is sleeping.  Do not lift anything that is heavier than your baby or 10 lb (4.5 kg) until your health care provider says that it is safe.  Talk with your health care provider about when you can engage in sexual activity. This may depend on your: ? Risk of infection. ? Rate of healing. ? Comfort and desire to engage in sexual activity. Vaginal Care  If you have an episiotomy or a vaginal tear, check the area every day for signs of infection. Check for: ? More redness,  swelling, or pain. ? More fluid or blood. ? Warmth. ? Pus or a bad smell.  Do not use tampons or douches until your health care provider says this is safe.  Watch for any blood clots that may pass from your vagina. These may look like clumps of dark red, brown, or black discharge. General instructions  Keep your perineum clean and dry as told by your health care provider.  Wear loose, comfortable clothing.  Wipe from front to back when you use the toilet.  Ask your health care provider if you can shower or take a bath. If you had an episiotomy or a perineal tear during labor and delivery, your health care provider may tell you not to take baths for a certain length of time.  Wear a bra that supports your breasts and fits you well.  If possible, have someone help you with household activities and help care for your baby for at least a few days after you leave the hospital.  Keep all follow-up visits for you and your baby as told by your health care  provider. This is important. Contact a health care provider if:  You have: ? Vaginal discharge that has a bad smell. ? Difficulty urinating. ? Pain when urinating. ? A sudden increase or decrease in the frequency of your bowel movements. ? More redness, swelling, or pain around your episiotomy or vaginal tear. ? More fluid or blood coming from your episiotomy or vaginal tear. ? Pus or a bad smell coming from your episiotomy or vaginal tear. ? A fever. ? A rash. ? Little or no interest in activities you used to enjoy. ? Questions about caring for yourself or your baby.  Your episiotomy or vaginal tear feels warm to the touch.  Your episiotomy or vaginal tear is separating or does not appear to be healing.  Your breasts are painful, hard, or turn red.  You feel unusually sad or worried.  You feel nauseous or you vomit.  You pass large blood clots from your vagina. If you pass a blood clot from your vagina, save it to show to  your health care provider. Do not flush blood clots down the toilet without having your health care provider look at them.  You urinate more than usual.  You are dizzy or light-headed.  You have not breastfed at all and you have not had a menstrual period for 12 weeks after delivery.  You have stopped breastfeeding and you have not had a menstrual period for 12 weeks after you stopped breastfeeding. Get help right away if:  You have: ? Pain that does not go away or does not get better with medicine. ? Chest pain. ? Difficulty breathing. ? Blurred vision or spots in your vision. ? Thoughts about hurting yourself or your baby.  You develop pain in your abdomen or in one of your legs.  You develop a severe headache.  You faint.  You bleed from your vagina so much that you fill two sanitary pads in one hour. This information is not intended to replace advice given to you by your health care provider. Make sure you discuss any questions you have with your health care provider. Document Released: 02/06/2000 Document Revised: 07/23/2015 Document Reviewed: 02/23/2015 Elsevier Interactive Patient Education  2017 [employer].

## 2016-09-09 NOTE — Discharge Summary (Signed)
OB Discharge Summary     Patient Name: Cassandra RuddleLashanda M Kirschenmann DOB: 1990/06/02 MRN: [id number]  Date of admission: 09/08/2016 Delivering MD: Caryl AdaPHELPS, JAZMA Y   Date of discharge: 09/09/2016  Admitting diagnosis: 37 WEEKS ROM Intrauterine pregnancy: 345w0d     Secondary diagnosis:  Active Problems:   Leakage, amniotic fluid   NSVD (normal spontaneous vaginal delivery)   Status post tubal ligation at time of delivery, current hospitalization  Additional problems: None     Discharge diagnosis: Term Pregnancy Delivered and s/p tubal ligation                                                                                                Post partum procedures:postpartum tubal ligation  Augmentation: AROM and Pitocin  Complications: None  Hospital course:  Onset of Labor With Vaginal Delivery     26 y.o. yo G3P3003 at 7045w0d was admitted in Active Labor on 09/08/2016. Patient had an uncomplicated labor course as follows:  Membrane Rupture Time/Date: 7:04 AM ,09/08/2016   Intrapartum Procedures: Episiotomy: None [1]                                         Lacerations:     Patient had a delivery of a Viable infant. 09/08/2016  Information for the patient's newborn:  Hinda Lenishomas, Girl Phoebe [[id number]][[id number]]  Delivery Method: Vaginal, Spontaneous Delivery (Filed from Delivery Summary)    Pateint had an uncomplicated postpartum course, had a postpartum tubal ligation day of delivery, uncomplicated.  She is ambulating, tolerating a regular diet, passing flatus, and urinating well. Patient is discharged home in stable condition on 09/09/16.   Physical exam  Vitals:   09/08/16 1803 09/08/16 2045 09/09/16 0058 09/09/16 0521  BP: (!) 108/52 107/61 104/62 (!) 109/51  Pulse: 75 66 70 68  Resp: 18 18 18 18   Temp:  98.2 F (36.8 C) 98.4 F (36.9 C) 98.5 F (36.9 C)  TempSrc:  Oral Oral Oral  SpO2: 100% 98% 98%   Weight:      Height:       General: alert, cooperative and no distress Lochia:  appropriate Uterine Fundus: firm Incision: Healing well with no significant drainage, No significant erythema, Dressing is clean, dry, and intact DVT Evaluation: No evidence of DVT seen on physical exam. Negative Homan's sign. No cords or calf tenderness. Labs: Lab Results  Component Value Date   WBC 12.1 (H) 09/08/2016   HGB 9.0 (L) 09/08/2016   HCT 26.8 (L) 09/08/2016   MCV 84.3 09/08/2016   PLT 231 09/08/2016   CMP Latest Ref Rng & Units 09/13/2013  Glucose 70 - 99 mg/dL 89  BUN 6 - 23 mg/dL 12  Creatinine 2.720.50 - 5.361.10 mg/dL 6.440.77  Sodium 034137 - 742147 mEq/L 141  Potassium 3.7 - 5.3 mEq/L 4.5  Chloride 96 - 112 mEq/L 104  CO2 19 - 32 mEq/L 27  Calcium 8.4 - 10.5 mg/dL 9.6  Total Protein - -  Total Bilirubin - -  Alkaline  Phos - -  AST - -  ALT - -    Discharge instruction: per After Visit Summary and "Baby and Me Booklet".  After visit meds:  Allergies as of 09/09/2016   No Known Allergies     Medication List    TAKE these medications   CITRANATAL ASSURE 35-1 & 300 MG tablet One tablet and one capsule daily   docusate sodium 100 MG capsule Commonly known as:  COLACE Take 1 capsule (100 mg total) by mouth 2 (two) times daily.   ferrous sulfate 325 (65 FE) MG tablet Take 325 mg by mouth 3 (three) times daily with meals.   ibuprofen 600 MG tablet Commonly known as:  ADVIL,MOTRIN Take 1 tablet (600 mg total) by mouth every 6 (six) hours.   oxyCODONE-acetaminophen 5-325 MG tablet Commonly known as:  ROXICET Take 1-2 tablets by mouth every 4 (four) hours as needed for severe pain.       Diet: routine diet  Activity: Advance as tolerated. Pelvic rest for 6 weeks.   Outpatient follow up:6 weeks Follow up Appt:No future appointments. Follow up Visit:No Follow-up on file.  Postpartum contraception: Tubal Ligation  Newborn Data: Live born female  Birth Weight: 7 lb 4.6 oz (3306 g) APGAR: 8, 9  Baby Feeding: Bottle and Breast Disposition:home with  mother   09/09/2016 Jen Mow, DO

## 2016-09-09 NOTE — Anesthesia Postprocedure Evaluation (Signed)
Anesthesia Post Note  Patient: Cassandra RuddleLashanda M Berg  Procedure(s) Performed: Procedure(s) (LRB): POST PARTUM TUBAL LIGATION (Bilateral)     Patient location during evaluation: Mother Baby Anesthesia Type: Epidural Level of consciousness: awake Pain management: pain level controlled Vital Signs Assessment: post-procedure vital signs reviewed and stable Respiratory status: spontaneous breathing Cardiovascular status: stable Postop Assessment: no headache, no backache, epidural receding, patient able to bend at knees, no signs of nausea or vomiting and adequate PO intake Anesthetic complications: no    Last Vitals:  Vitals:   09/09/16 0058 09/09/16 0521  BP: 104/62 (!) 109/51  Pulse: 70 68  Resp: 18 18  Temp: 36.9 C 36.9 C    Last Pain:  Vitals:   09/09/16 0533  TempSrc:   PainSc: 3    Pain Goal: Patients Stated Pain Goal: 4 (09/08/16 1653)               Aliegha Paullin

## 2016-09-13 ENCOUNTER — Telehealth: Payer: Self-pay | Admitting: *Deleted

## 2016-09-13 NOTE — Telephone Encounter (Signed)
[facility] Hgb of 9.6 on Cassandra Berg. Requested that she inform pt to eat foods rich in iron, like green leafy veg, nut, beans, and red meat; and to continue to take her PNV.

## 2016-12-05 IMAGING — US US OB TRANSVAGINAL
1 series · 14 of 28 positions shown · non-contrast
Comparison: None.

CLINICAL DATA: Acute onset right-sided pelvic pain today.
Gestational age by LMP of 4 weeks 4 days.

EXAM:
OBSTETRIC <14 WK US AND TRANSVAGINAL OB US
TECHNIQUE: Both transabdominal and transvaginal ultrasound examinations were
performed for complete evaluation of the gestation as well as the
maternal uterus, adnexal regions, and pelvic cul-de-sac.
Transvaginal technique was performed to assess early pregnancy.

[Series 1: us ob transvaginal · 0.17mm/px · 14 of 133 slices shown]
[im 5/133]
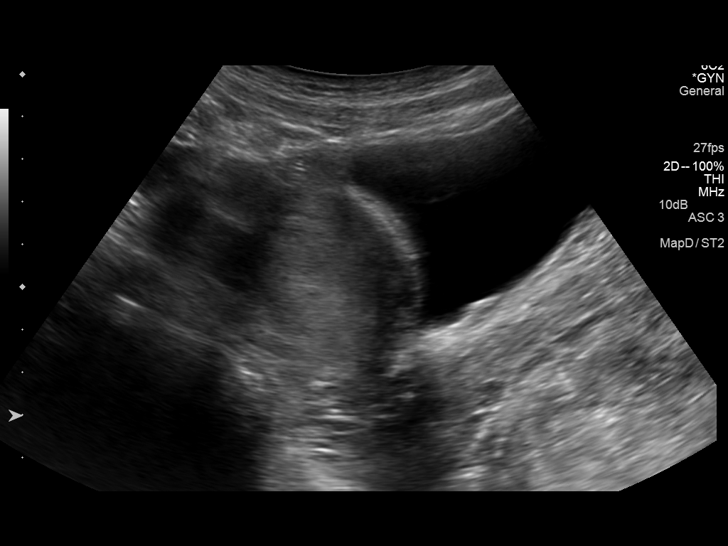
[im 15/133]
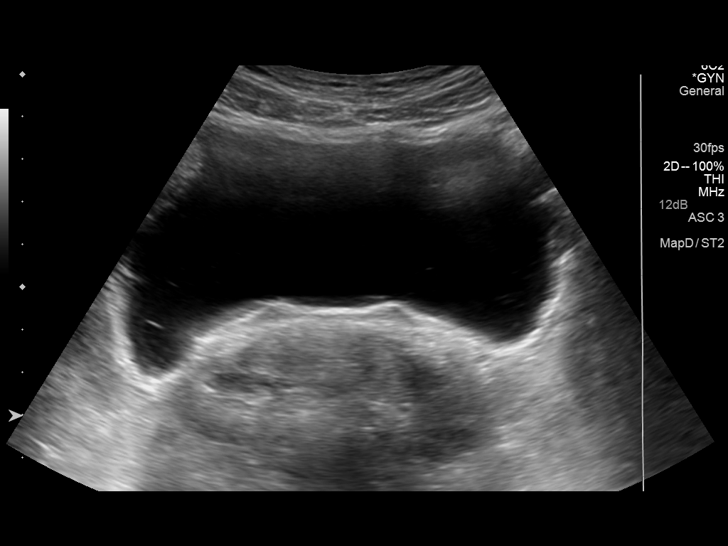
[im 25/133]
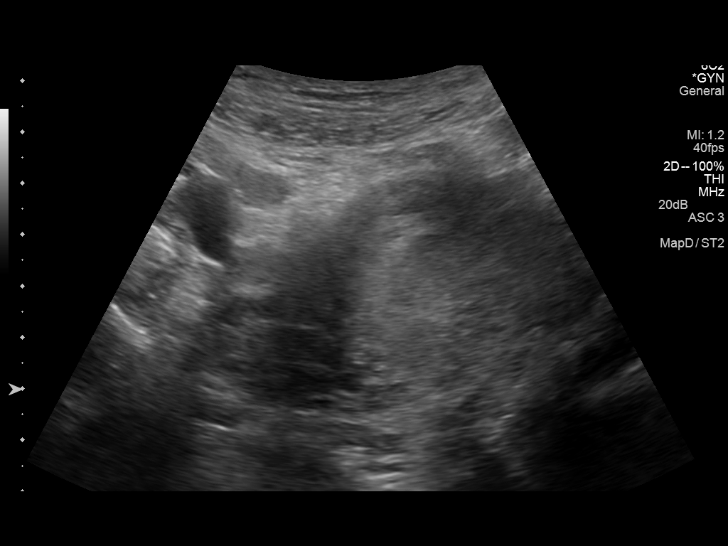
[im 35/133]
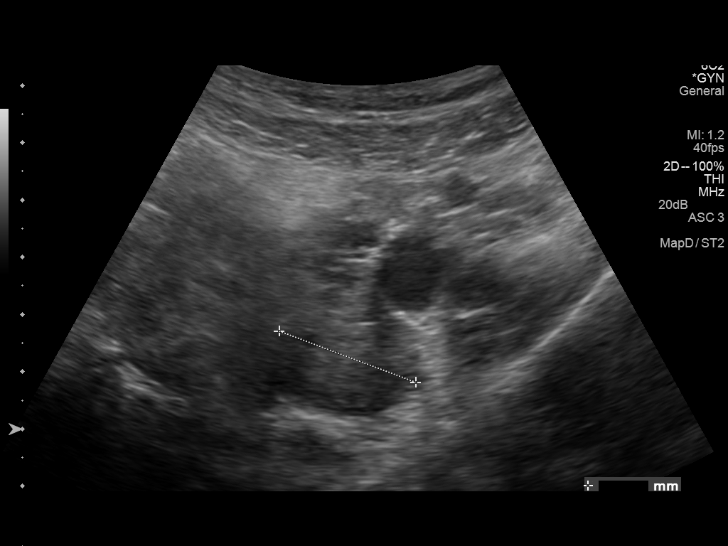
[im 45/133]
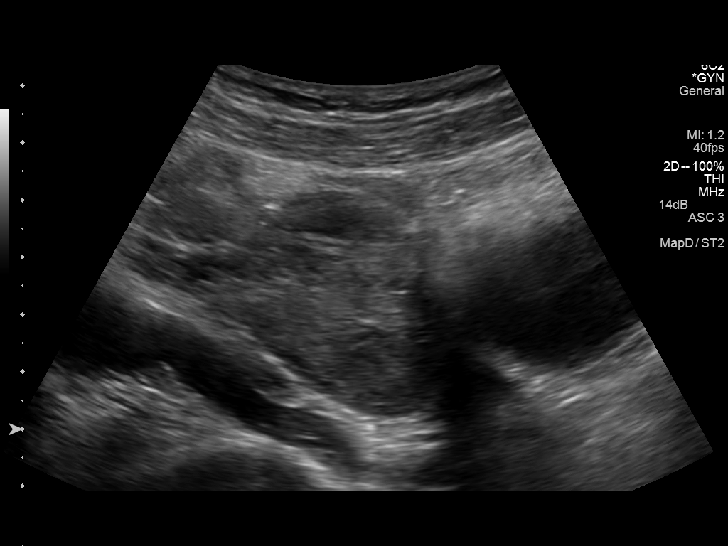
[im 54/133]
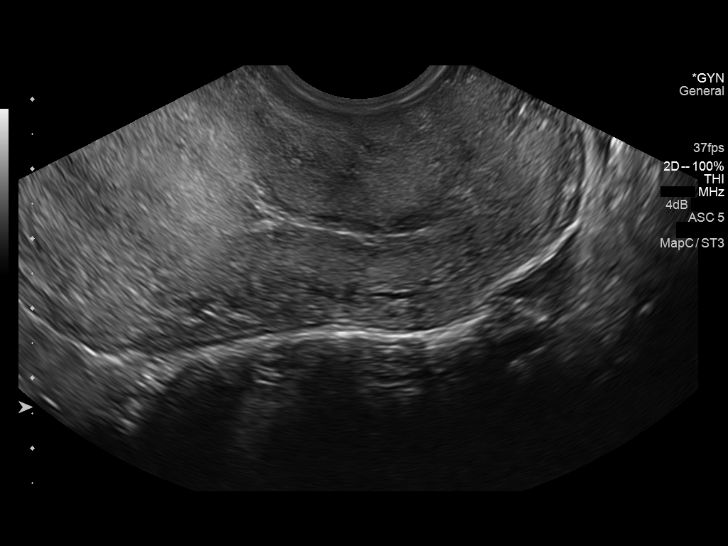
[im 64/133]
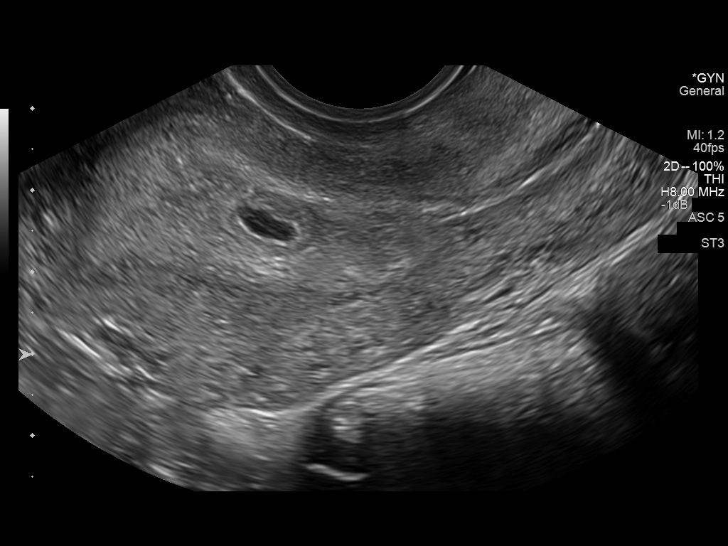
[im 74/133]
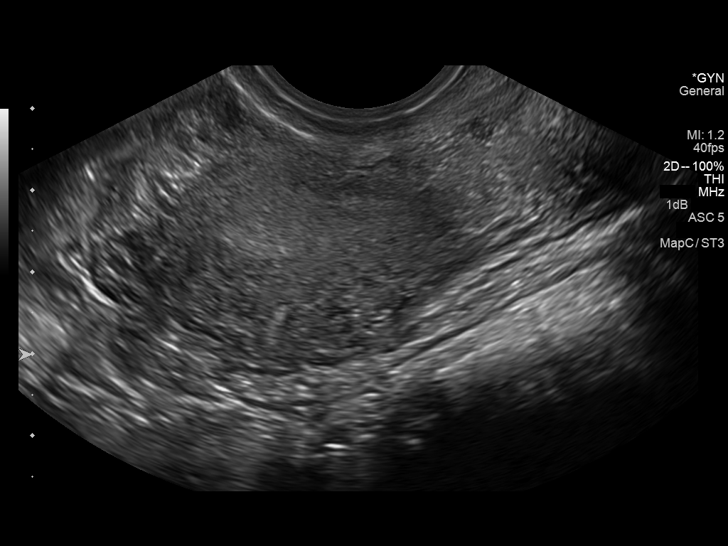
[im 84/133]
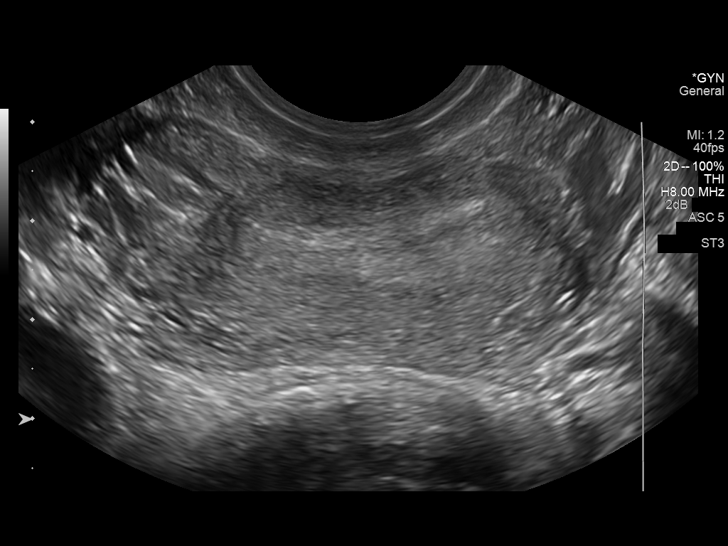
[im 93/133]
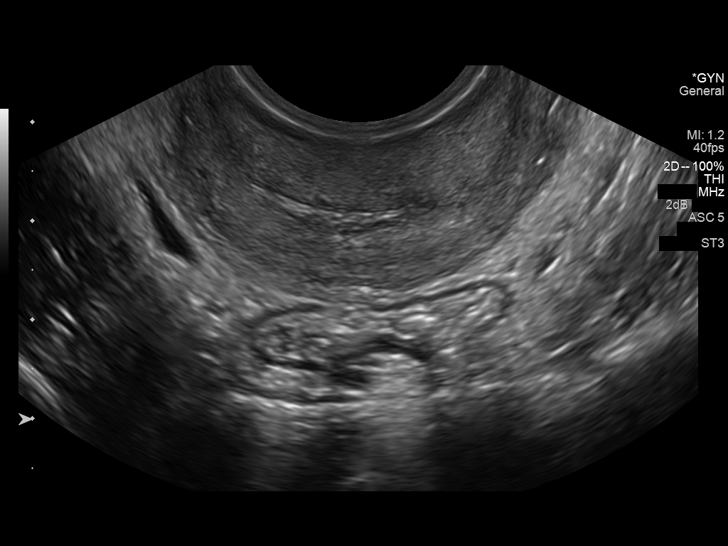
[im 103/133]
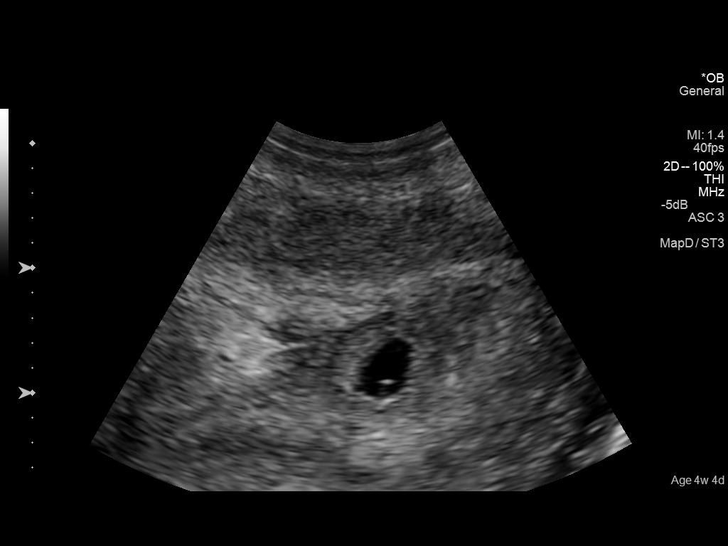
[im 113/133]
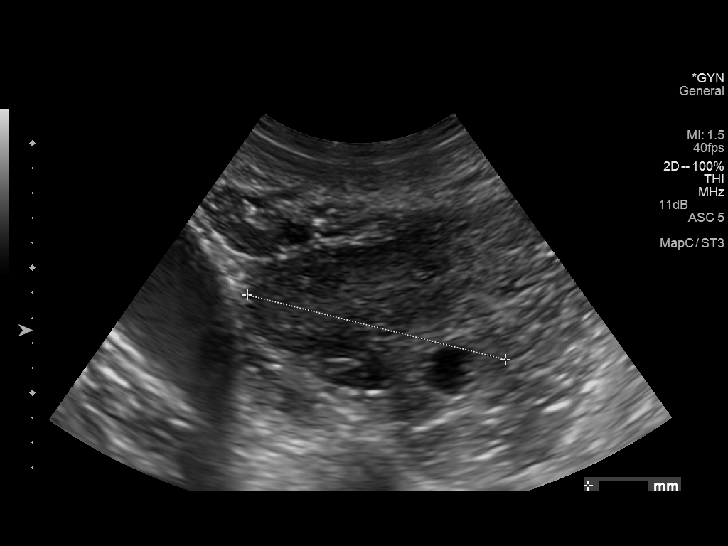
[im 123/133]
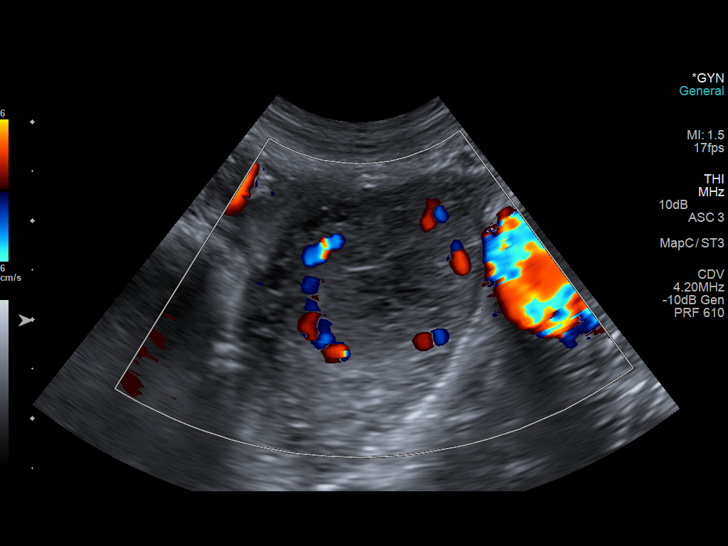
[im 133/133]
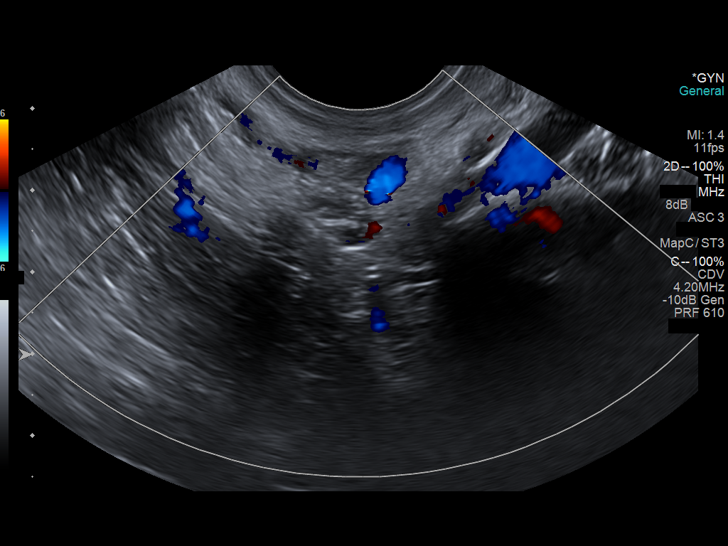

[14 of 28 positions shown; findings below may reference images not displayed]

FINDINGS: Intrauterine gestational sac: Visualized/normal in shape.

Yolk sac:  Visualized

Embryo:  Not visualized

MSD: 6  mm   5 w   2  d

Maternal uterus/adnexae: Both ovaries are normal in appearance.
Small left ovarian corpus luteum incidentally noted. No mass or
significant free fluid visualized.
IMPRESSION: Single early approximately 5 week intrauterine gestational sac,
which is concordant with LMP. Recommend followup of quantitative HCG
levels, and consider followup ultrasound to assess viability in 10
days.

## 2016-12-16 ENCOUNTER — Encounter (HOSPITAL_COMMUNITY): Payer: Self-pay

## 2017-02-04 ENCOUNTER — Other Ambulatory Visit: Payer: Self-pay

## 2017-02-04 ENCOUNTER — Emergency Department (HOSPITAL_COMMUNITY)
Admission: EM | Admit: 2017-02-04 | Discharge: 2017-02-04 | Disposition: A | Discharge status: Home / Self Care | Payer: Self-pay | Attending: Emergency Medicine | Admitting: Emergency Medicine

## 2017-02-04 ENCOUNTER — Encounter (HOSPITAL_COMMUNITY): Payer: Self-pay | Admitting: Emergency Medicine

## 2017-02-04 DIAGNOSIS — H6591 Unspecified nonsuppurative otitis media, right ear: Secondary | ICD-10-CM | POA: Insufficient documentation

## 2017-02-04 DIAGNOSIS — F17210 Nicotine dependence, cigarettes, uncomplicated: Secondary | ICD-10-CM | POA: Insufficient documentation

## 2017-02-04 MED ORDER — IBUPROFEN 600 MG PO TABS: 600.0000 mg | ORAL_TABLET | Freq: Four times a day (QID) | ORAL | 0 refills | Status: AC | PRN

## 2017-02-04 MED ORDER — AMOXICILLIN 500 MG PO CAPS: 500.0000 mg | ORAL_CAPSULE | Freq: Three times a day (TID) | ORAL | 0 refills | Status: AC

## 2017-02-04 NOTE — ED Notes (Signed)
Patient complaining of right ear pain and states "it's been stopped up for about a month."

## 2017-02-04 NOTE — ED Triage Notes (Signed)
PT c/o decreased hearing and pressure behind right ear for the past month.

## 2017-02-04 NOTE — Discharge Instructions (Signed)
Follow-up with your doctor for recheck.  Also try taking Claritin or Zyrtec as directed for 2 weeks.

## 2017-02-06 NOTE — ED Provider Notes (Signed)
[facility]ENCY [facility]PARTMENT Provider Note   CSN: [id number] Arrival date & time: 02/04/17  1103     History   Chief Complaint Chief Complaint  Patient presents with  . Otalgia    HPI Cassandra Berg is a 26 y.o. female.  HPI  Cassandra Berg is a 26 y.o. female who presents to the Emergency Department complaining of persistent right ear pain for 1 month.  States her symptoms have been gradually worsening.  She now complains of decreased hearing and pressure to her right ear.  Initially, she states her symptoms began as what she thought was a cold.  She is tried to clean her ear with Q-tips without relief.  She denies neck pain, fever, dizziness, headaches and dental pain.   Past Medical History:  Diagnosis Date  . Anemia   . Vaginal Pap smear, abnormal     Patient Active Problem List   Diagnosis Date Noted  . NSVD (normal spontaneous vaginal delivery) 09/09/2016  . Status post tubal ligation at time of delivery, current hospitalization 09/09/2016  . Leakage, amniotic fluid 09/08/2016  . Chlamydia infection complicating pregnancy, second trimester 06/21/2016  . Anemia in pregnancy 06/18/2016  . Supervision of normal pregnancy 06/17/2016  . Late prenatal care affecting pregnancy in second trimester 06/17/2016  . Short interval between pregnancies affecting pregnancy, antepartum 06/17/2016  . 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 History of shoulder dystocia in prior pregnancy 02/26/2015    Past Surgical History:  Procedure Laterality Date  . TUBAL LIGATION Bilateral 09/08/2016   Procedure: POST PARTUM TUBAL LIGATION;  Surgeon: Federico FlakeNewton, Kimberly Niles, MD;  Location: [facility];  Service: Gynecology;  Laterality: Bilateral;  . WISDOM TOOTH EXTRACTION      OB History    Gravida Para Term Preterm AB Living   3 3 3     3    SAB TAB Ectopic Multiple Live Births         0 3       Home Medications    Prior to Admission medications   Medication Sig Start Date  End Date Taking? Authorizing Provider  amoxicillin (AMOXIL) 500 MG capsule Take 1 capsule (500 mg total) by mouth 3 (three) times daily. 02/04/17   Bandon Sherwin, PA-C  docusate sodium (COLACE) 100 MG capsule Take 1 capsule (100 mg total) by mouth 2 (two) times daily. 09/09/16   Mumaw, Hiram ComberElizabeth Woodland, DO  ferrous sulfate 325 (65 FE) MG tablet Take 325 mg by mouth 3 (three) times daily with meals.     [provider]  ibuprofen (ADVIL,MOTRIN) 600 MG tablet Take 1 tablet (600 mg total) by mouth every 6 (six) hours as needed. Take with food 02/04/17   Corayma Cashatt, PA-C  oxyCODONE-acetaminophen (ROXICET) 5-325 MG tablet Take 1-2 tablets by mouth every 4 (four) hours as needed for severe pain. 09/09/16   Mumaw, Hiram ComberElizabeth Woodland, DO  Prenat w/o [id number] [facility](CITRANATAL ASSURE) 35-1 & 300 MG tablet One tablet and one capsule daily 06/03/16   Adline PotterGriffin, Jennifer A, NP    Family History Family History  Problem Relation Age of Onset  . Heart disease Mother   . Stroke Mother   . Hypertension Mother   . Diabetes Father   . Hyperlipidemia Father   . Cancer Maternal Grandmother        lung  . Diabetes Paternal Grandmother     Social History Social History   Tobacco Use  . Smoking status: Former Smoker    Packs/day: 0.25  Types: Cigarettes  . Smokeless tobacco: Never Used  Substance Use Topics  . Alcohol use: No    Comment: socially  . Drug use: No     Allergies   Patient has no known allergies.   Review of Systems Review of Systems  Constitutional: Negative for activity change, appetite change, chills and fever.  HENT: Positive for ear pain and hearing loss. Negative for congestion, ear discharge, facial swelling, rhinorrhea, sore throat and trouble swallowing.   Eyes: Negative for visual disturbance.  Respiratory: Negative for cough, shortness of breath, wheezing and stridor.   Gastrointestinal: Negative for nausea and vomiting.  Musculoskeletal: Negative  for neck pain and neck stiffness.  Skin: Negative for rash.  Neurological: Negative for dizziness, weakness, numbness and headaches.  Hematological: Negative for adenopathy.  Psychiatric/Behavioral: Negative for confusion.  All other systems reviewed and are negative.    Physical Exam Updated Vital Signs Pulse 97   Temp 98.4 F (36.9 C) (Oral)   Resp 16   Ht 5\' 6"  (1.676 m)   Wt 68 kg (150 lb)   LMP 02/01/2017   SpO2 97%   BMI 24.21 kg/m   Physical Exam  Constitutional: She is oriented to person, place, and time. She appears well-developed and well-nourished. No distress.  HENT:  Head: Normocephalic and atraumatic.  Mouth/Throat: Uvula is midline, oropharynx is clear and moist and mucous membranes are normal. No uvula swelling. No oropharyngeal exudate.  Erythema of the right TM.  Mild bulging is present and loss of landmarks  Neck: Normal range of motion. Neck supple.  Cardiovascular: Normal rate, regular rhythm and intact distal pulses.  No murmur heard. Pulmonary/Chest: Effort normal and breath sounds normal. No stridor. No respiratory distress.  Lymphadenopathy:    She has no cervical adenopathy.  Neurological: She is alert and oriented to person, place, and time. Coordination normal.  Skin: Skin is warm and dry. No rash noted.  Nursing note and vitals reviewed.    ED Treatments / Results  Labs (all labs ordered are listed, but only abnormal results are displayed) Labs Reviewed - No data to display  EKG  EKG Interpretation None       Radiology No results found.  Procedures Procedures (including critical care time)  Medications Ordered in ED Medications - No data to display   Initial Impression / Assessment and Plan / ED Course  I have reviewed the triage vital signs and the nursing notes.  Pertinent labs & imaging results that were available during my care of the patient were reviewed by me and considered in my medical decision making (see chart  for details).     Patient with acute right otitis media.  No perforation.  Will treat with amoxicillin appears stable for discharge agrees to PCP follow-up if needed  Final Clinical Impressions(s) / ED Diagnoses   Final diagnoses:  Right non-suppurative otitis media    ED Discharge Orders        Ordered    amoxicillin (AMOXIL) 500 MG capsule  3 times daily     02/04/17 1226    ibuprofen (ADVIL,MOTRIN) 600 MG tablet  Every 6 hours PRN     02/04/17 [id number]1226       Shawnice Tilmon, Helenaammy, PA-C 02/06/17 1945    Donnetta Hutchingook, Brian, MD 02/07/17 [phone]

## 2017-03-05 ENCOUNTER — Emergency Department (HOSPITAL_COMMUNITY)
Admission: EM | Admit: 2017-03-05 | Discharge: 2017-03-05 | Disposition: A | Discharge status: Home / Self Care | Payer: Self-pay | Attending: Emergency Medicine | Admitting: Emergency Medicine

## 2017-03-05 ENCOUNTER — Emergency Department (HOSPITAL_COMMUNITY): Payer: Self-pay

## 2017-03-05 ENCOUNTER — Encounter (HOSPITAL_COMMUNITY): Payer: Self-pay | Admitting: Emergency Medicine

## 2017-03-05 DIAGNOSIS — N760 Acute vaginitis: Secondary | ICD-10-CM

## 2017-03-05 DIAGNOSIS — J069 Acute upper respiratory infection, unspecified: Secondary | ICD-10-CM

## 2017-03-05 DIAGNOSIS — B9689 Other specified bacterial agents as the cause of diseases classified elsewhere: Secondary | ICD-10-CM

## 2017-03-05 DIAGNOSIS — D72828 Other elevated white blood cell count: Secondary | ICD-10-CM

## 2017-03-05 DIAGNOSIS — Z79899 Other long term (current) drug therapy: Secondary | ICD-10-CM | POA: Insufficient documentation

## 2017-03-05 DIAGNOSIS — N739 Female pelvic inflammatory disease, unspecified: Secondary | ICD-10-CM | POA: Insufficient documentation

## 2017-03-05 DIAGNOSIS — R05 Cough: Secondary | ICD-10-CM

## 2017-03-05 DIAGNOSIS — N898 Other specified noninflammatory disorders of vagina: Secondary | ICD-10-CM

## 2017-03-05 DIAGNOSIS — Z87891 Personal history of nicotine dependence: Secondary | ICD-10-CM | POA: Insufficient documentation

## 2017-03-05 DIAGNOSIS — N730 Acute parametritis and pelvic cellulitis: Secondary | ICD-10-CM

## 2017-03-05 DIAGNOSIS — D729 Disorder of white blood cells, unspecified: Secondary | ICD-10-CM | POA: Insufficient documentation

## 2017-03-05 DIAGNOSIS — R102 Pelvic and perineal pain: Secondary | ICD-10-CM

## 2017-03-05 DIAGNOSIS — Z720 Tobacco use: Secondary | ICD-10-CM

## 2017-03-05 DIAGNOSIS — R1030 Lower abdominal pain, unspecified: Secondary | ICD-10-CM

## 2017-03-05 DIAGNOSIS — R059 Cough, unspecified: Secondary | ICD-10-CM

## 2017-03-05 DIAGNOSIS — R197 Diarrhea, unspecified: Secondary | ICD-10-CM

## 2017-03-05 LAB — URINALYSIS, ROUTINE W REFLEX MICROSCOPIC
Bacteria, UA: NONE SEEN
Bilirubin Urine: NEGATIVE
Glucose, UA: NEGATIVE mg/dL
Hgb urine dipstick: NEGATIVE
KETONES UR: NEGATIVE mg/dL
Nitrite: NEGATIVE
PROTEIN: NEGATIVE mg/dL
Specific Gravity, Urine: 1.02 (ref 1.005–1.030)
pH: 7 (ref 5.0–8.0)

## 2017-03-05 LAB — COMPREHENSIVE METABOLIC PANEL
ALBUMIN: 3.8 g/dL (ref 3.5–5.0)
ALT: 18 U/L (ref 14–54)
ANION GAP: 6 (ref 5–15)
AST: 23 U/L (ref 15–41)
Alkaline Phosphatase: 77 U/L (ref 38–126)
BUN: 11 mg/dL (ref 6–20)
CHLORIDE: 106 mmol/L (ref 101–111)
CO2: 26 mmol/L (ref 22–32)
CREATININE: 0.73 mg/dL (ref 0.44–1.00)
Calcium: 8.8 mg/dL — ABNORMAL LOW (ref 8.9–10.3)
GFR calc Af Amer: >60 mL/min (ref >60)
GFR calc non Af Amer: >60 mL/min (ref >60)
GLUCOSE: 98 mg/dL (ref 65–99)
Potassium: 3.7 mmol/L (ref 3.5–5.1)
SODIUM: 138 mmol/L (ref 135–145)
Total Bilirubin: 0.8 mg/dL (ref 0.3–1.2)
Total Protein: 7.9 g/dL (ref 6.5–8.1)

## 2017-03-05 LAB — I-STAT BETA HCG BLOOD, ED (MC, WL, AP ONLY)

## 2017-03-05 LAB — DIFFERENTIAL
BASOS ABS: 0 10*3/uL (ref 0.0–0.1)
BASOS PCT: 0 %
EOS ABS: 0.1 10*3/uL (ref 0.0–0.7)
Eosinophils Relative: 1 %
Lymphocytes Relative: 14 %
Lymphs Abs: 1.8 10*3/uL (ref 0.7–4.0)
Monocytes Absolute: 0.7 10*3/uL (ref 0.1–1.0)
Monocytes Relative: 5 %
Neutro Abs: 10.6 10*3/uL — ABNORMAL HIGH (ref 1.7–7.7)
Neutrophils Relative %: 80 %

## 2017-03-05 LAB — CBC
HCT: 34.1 % — ABNORMAL LOW (ref 36.0–46.0)
HEMOGLOBIN: 11.2 g/dL — AB (ref 12.0–15.0)
MCH: 28.3 pg (ref 26.0–34.0)
MCHC: 32.8 g/dL (ref 30.0–36.0)
MCV: 86.1 fL (ref 78.0–100.0)
Platelets: 320 10*3/uL (ref 150–400)
RBC: 3.96 MIL/uL (ref 3.87–5.11)
RDW: 16.7 % — ABNORMAL HIGH (ref 11.5–15.5)
WBC: 14.2 10*3/uL — ABNORMAL HIGH (ref 4.0–10.5)

## 2017-03-05 LAB — WET PREP, GENITAL
Sperm: NONE SEEN
Trich, Wet Prep: NONE SEEN
Yeast Wet Prep HPF POC: NONE SEEN

## 2017-03-05 LAB — LIPASE, BLOOD: LIPASE: 21 U/L (ref 11–51)

## 2017-03-05 LAB — I-STAT TROPONIN, ED: TROPONIN I, POC: 0 ng/mL (ref 0.00–0.08)

## 2017-03-05 MED ORDER — SODIUM CHLORIDE 0.9 % IV BOLUS (SEPSIS)
1000.0000 mL | Freq: Once | INTRAVENOUS | Status: AC
Start: 2017-03-05 — End: 2017-03-05
  Administered 2017-03-05: 1000 mL via INTRAVENOUS

## 2017-03-05 MED ORDER — METRONIDAZOLE 500 MG PO TABS
2000.0000 mg | ORAL_TABLET | Freq: Once | ORAL | Status: AC
Start: 2017-03-05 — End: 2017-03-05
  Administered 2017-03-05: 2000 mg via ORAL
  Filled 2017-03-05: qty 4

## 2017-03-05 MED ORDER — DEXTROSE 5 % IV SOLN
1.0000 g | Freq: Once | INTRAVENOUS | Status: AC
  Administered 2017-03-05: 1 g via INTRAVENOUS
  Filled 2017-03-05 (×2): qty 10

## 2017-03-05 MED ORDER — KETOROLAC TROMETHAMINE 30 MG/ML IJ SOLN
30.0000 mg | Freq: Once | INTRAMUSCULAR | Status: AC
  Administered 2017-03-05: 30 mg via INTRAVENOUS
  Filled 2017-03-05: qty 1

## 2017-03-05 MED ORDER — METRONIDAZOLE 500 MG PO TABS: 500.0000 mg | ORAL_TABLET | Freq: Two times a day (BID) | ORAL | 0 refills | Status: DC

## 2017-03-05 MED ORDER — AZITHROMYCIN 250 MG PO TABS
1000.0000 mg | ORAL_TABLET | Freq: Once | ORAL | Status: AC
  Administered 2017-03-05: 1000 mg via ORAL
  Filled 2017-03-05: qty 4

## 2017-03-05 MED ORDER — DOXYCYCLINE HYCLATE 100 MG PO CAPS: 100.0000 mg | ORAL_CAPSULE | Freq: Two times a day (BID) | ORAL | 0 refills | Status: AC

## 2017-03-05 MED ORDER — MORPHINE SULFATE (PF) 4 MG/ML IV SOLN
4.0000 mg | Freq: Once | INTRAVENOUS | Status: AC
  Administered 2017-03-05: 4 mg via INTRAVENOUS
  Filled 2017-03-05: qty 1

## 2017-03-05 NOTE — ED Provider Notes (Signed)
[facility]-EMERGENCY DEPT Provider Note   CSN: [id number] Arrival date & time: 03/05/17  1556     History   Chief Complaint Chief Complaint  Patient presents with  . Abdominal Pain  . Chest Pain    HPI Cassandra Berg is a 27 y.o. female with a PMHx of anemia, and PSHx of tubal ligation, who presents to the ED with multiple complaints, but primarily complaining of 2 days of lower abdominal pain.  She describes the pain as 10/10 constant sharp and aching lower abdominal pain that radiates into her lower back, worse with coughing or movement/activity, and minimally improved with Tylenol.  She reports associated a clear yellowish vaginal discharge, dysuria, malodorous urine, and 2 daily episodes of nonbloody looser than normal diarrhea although denies any watery diarrhea.  She also complains of having a cold with symptoms of cough with yellow sputum production and rhinorrhea which have been going on since last week.  She endorses being a cigarette smoker.  Her LMP began 6 days ago and ended 2 days ago.  She is sexually active with 2 female partners, unprotected with one.  She is NOT breastfeeding.   She denies fevers, chills, ear pain/drainage, sore throat, hemoptysis, CP, SOB, LE swelling, recent travel/surgery/immobilization, estrogen use, personal/family hx of DVT/PE, nausea/vomiting, constipation, obstipation, melena, hematochezia, hematuria, urinary frequency, vaginal bleeding, genital sores, myalgias, arthralgias, numbness, tingling, focal weakness, or any other complaints at this time. Denies recent travel, sick contacts, suspicious food intake, frequent EtOH use, or NSAID use.    The history is provided by the patient and medical records. No language interpreter was used.  Abdominal Pain   This is a new problem. The current episode started 2 days ago. The problem occurs constantly. The problem has not changed since onset.The pain is associated with an unknown  factor. The pain is located in the RLQ and LLQ. The quality of the pain is sharp and aching. The pain is at a severity of 10/10. The pain is moderate. Associated symptoms include diarrhea and dysuria. Pertinent negatives include fever, flatus, hematochezia, melena, nausea, vomiting, constipation, frequency, hematuria, arthralgias and myalgias. The symptoms are aggravated by activity and coughing. Nothing relieves the symptoms.  Chest Pain   Associated symptoms include abdominal pain and cough. Pertinent negatives include no fever, no nausea, no numbness, no shortness of breath, no vomiting and no weakness.    Past Medical History:  Diagnosis Date  . Anemia   . Vaginal Pap smear, abnormal     Patient Active Problem List   Diagnosis Date Noted  . NSVD (normal spontaneous vaginal delivery) 09/09/2016  . Status post tubal ligation at time of delivery, current hospitalization 09/09/2016  . Leakage, amniotic fluid 09/08/2016  . Chlamydia infection complicating pregnancy, second trimester 06/21/2016  . Anemia in pregnancy 06/18/2016  . Supervision of normal pregnancy 06/17/2016  . Late prenatal care affecting pregnancy in second trimester 06/17/2016  . Short interval between pregnancies affecting pregnancy, antepartum 06/17/2016  . 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 History of shoulder dystocia in prior pregnancy 02/26/2015    Past Surgical History:  Procedure Laterality Date  . TUBAL LIGATION Bilateral 09/08/2016   Procedure: POST PARTUM TUBAL LIGATION;  Surgeon: Federico Flake, MD;  Location: [facility] BIRTHING SUITES;  Service: Gynecology;  Laterality: Bilateral;  . WISDOM TOOTH EXTRACTION      OB History    Gravida Para Term Preterm AB Living   3 3 3     3    SAB  TAB Ectopic Multiple Live Births         0 3       Home Medications    Prior to Admission medications   Medication Sig Start Date End Date Taking? Authorizing Provider  amoxicillin (AMOXIL) 500 MG capsule Take 1 capsule  (500 mg total) by mouth 3 (three) times daily. 02/04/17   Triplett, Tammy, PA-C  docusate sodium (COLACE) 100 MG capsule Take 1 capsule (100 mg total) by mouth 2 (two) times daily. 09/09/16   Mumaw, Hiram ComberElizabeth Woodland, DO  ferrous sulfate 325 (65 FE) MG tablet Take 325 mg by mouth 3 (three) times daily with meals.     [provider]  ibuprofen (ADVIL,MOTRIN) 600 MG tablet Take 1 tablet (600 mg total) by mouth every 6 (six) hours as needed. Take with food 02/04/17   Triplett, Tammy, PA-C  oxyCODONE-acetaminophen (ROXICET) 5-325 MG tablet Take 1-2 tablets by mouth every 4 (four) hours as needed for severe pain. 09/09/16   Mumaw, Hiram ComberElizabeth Woodland, DO  Prenat w/o A-[id number]-[id number] [facility](CITRANATAL ASSURE) 35-1 & 300 MG tablet One tablet and one capsule daily 06/03/16   Adline PotterGriffin, Jennifer A, NP    Family History Family History  Problem Relation Age of Onset  . Heart disease Mother   . Stroke Mother   . Hypertension Mother   . Diabetes Father   . Hyperlipidemia Father   . Cancer Maternal Grandmother        lung  . Diabetes Paternal Grandmother     Social History Social History   Tobacco Use  . Smoking status: Former Smoker    Packs/day: 0.25    Types: Cigarettes  . Smokeless tobacco: Never Used  Substance Use Topics  . Alcohol use: No    Comment: socially  . Drug use: No     Allergies   Patient has no known allergies.   Review of Systems Review of Systems  Constitutional: Negative for chills and fever.  HENT: Positive for rhinorrhea. Negative for ear discharge, ear pain and sore throat.   Respiratory: Positive for cough. Negative for shortness of breath.        No hemoptysis  Cardiovascular: Negative for chest pain and leg swelling.  Gastrointestinal: Positive for abdominal pain and diarrhea. Negative for blood in stool, constipation, flatus, hematochezia, melena, nausea and vomiting.  Genitourinary: Positive for dysuria and vaginal discharge. Negative for frequency,  genital sores, hematuria and vaginal bleeding.       +malodorous urine  Musculoskeletal: Negative for arthralgias and myalgias.  Skin: Negative for color change.  Allergic/Immunologic: Negative for immunocompromised state.  Neurological: Negative for weakness and numbness.  Psychiatric/Behavioral: Negative for confusion.   All other systems reviewed and are negative for acute change except as noted in the HPI.    Physical Exam Updated Vital Signs BP 117/70   Pulse 82   Temp 98 F (36.7 C) (Oral)   Resp 16   SpO2 98%   Physical Exam  Constitutional: She is oriented to person, place, and time. Vital signs are normal. She appears well-developed and well-nourished.  Non-toxic appearance. No distress.  Afebrile, nontoxic, NAD  HENT:  Head: Normocephalic and atraumatic.  Mouth/Throat: Oropharynx is clear and moist and mucous membranes are normal.  Eyes: Conjunctivae and EOM are normal. Right eye exhibits no discharge. Left eye exhibits no discharge.  Neck: Normal range of motion. Neck supple.  Cardiovascular: Normal rate, regular rhythm, normal heart sounds and intact distal pulses. Exam reveals no gallop and no friction rub.  No murmur heard. Pulmonary/Chest: Effort normal and breath sounds normal. No respiratory distress. She has no decreased breath sounds. She has no wheezes. She has no rhonchi. She has no rales.  CTAB in all lung fields, no w/r/r, no hypoxia or increased WOB, speaking in full sentences, SpO2 98% on RA   Abdominal: Soft. Normal appearance and bowel sounds are normal. She exhibits no distension. There is generalized tenderness. There is no rigidity, no rebound, no guarding, no CVA tenderness, no tenderness at McBurney's point and negative Murphy's sign.  Soft, nondistended, +BS throughout, with mild diffuse abdominal TTP throughout entire abdomen, lower>upper, but no discreet area of focal tenderness; no r/g/r, neg murphy's, neg mcburney's point specific tenderness, no  CVA TTP   Genitourinary: Uterus normal. Pelvic exam was performed with patient supine. There is no rash, tenderness or lesion on the right labia. There is no rash, tenderness or lesion on the left labia. Cervix exhibits motion tenderness and discharge. Cervix exhibits no friability. Right adnexum displays tenderness. Right adnexum displays no mass and no fullness. Left adnexum displays tenderness. Left adnexum displays no mass and no fullness. No erythema, tenderness or bleeding in the vagina. Vaginal discharge found.  Genitourinary Comments: Chaperone present for exam. No rashes, lesions, or tenderness to external genitalia. No erythema, injury, or tenderness to vaginal mucosa. Copious amount of yellowish grey vaginal discharge without bleeding within vaginal vault. No adnexal masses or fullness, but moderate b/l adnexal tenderness. +CMT, no cervical friability, +yellowish grey discharge from cervical os. Cervical os is closed. Uterus non-deviated, mobile, nonTTP, and without enlargement.    Musculoskeletal: Normal range of motion.  Neurological: She is alert and oriented to person, place, and time. She has normal strength. No sensory deficit.  Skin: Skin is warm, dry and intact. No rash noted.  Psychiatric: She has a normal mood and affect.  Nursing note and vitals reviewed.    ED Treatments / Results  Labs (all labs ordered are listed, but only abnormal results are displayed) Labs Reviewed  WET PREP, GENITAL - Abnormal; Notable for the following components:      Result Value   Clue Cells Wet Prep HPF POC PRESENT (*)    WBC, Wet Prep HPF POC MANY (*)    All other components within normal limits  COMPREHENSIVE METABOLIC PANEL - Abnormal; Notable for the following components:   Calcium 8.8 (*)    All other components within normal limits  CBC - Abnormal; Notable for the following components:   WBC 14.2 (*)    Hemoglobin 11.2 (*)    HCT 34.1 (*)    RDW 16.7 (*)    All other components  within normal limits  URINALYSIS, ROUTINE W REFLEX MICROSCOPIC - Abnormal; Notable for the following components:   Leukocytes, UA TRACE (*)    Squamous Epithelial / LPF 0-5 (*)    All other components within normal limits  DIFFERENTIAL - Abnormal; Notable for the following components:   Neutro Abs 10.6 (*)    All other components within normal limits  LIPASE, BLOOD  RPR  HIV ANTIBODY (ROUTINE TESTING)  I-STAT BETA HCG BLOOD, ED ([facility], [facility], [facility] ONLY)  I-STAT TROPONIN, ED  GC/CHLAMYDIA PROBE AMP ([place]) NOT AT [facility]    EKG  EKG Interpretation  Date/Time:  Saturday March 05 2017 17:01:38 EST Ventricular Rate:  85 PR Interval:    QRS Duration: 101 QT Interval:  339 QTC Calculation: 403 R Axis:   15 Text Interpretation:  Sinus rhythm Abnormal Q  suggests anterior infarct Borderline repolarization abnormality Baseline wander in lead(s) V2 No old tracing to compare Confirmed by Lorre Nick ([id number]) on 03/05/2017 7:15:29 PM       Radiology Dg Chest 2 View  Result Date: 03/05/2017 CLINICAL DATA:  Midline lower abdominal pain gradually spreading around to the flanks over the past few days. EXAM: CHEST  2 VIEW COMPARISON:  07/13/2007. FINDINGS: Normal sized heart.  Clear lungs.  Mild scoliosis. IMPRESSION: No acute abnormality. Electronically Signed   By: Beckie Salts M.D.   On: 03/05/2017 17:38   US Pelvis Transvanginal Non-ob (tv Only)  Result Date: 03/05/2017 CLINICAL DATA:  Pelvic pain EXAM: TRANSABDOMINAL AND TRANSVAGINAL ULTRASOUND OF PELVIS DOPPLER ULTRASOUND OF OVARIES TECHNIQUE: Both transabdominal and transvaginal ultrasound examinations of the pelvis were performed. Transabdominal technique was performed for global imaging of the pelvis including uterus, ovaries, adnexal regions, and pelvic cul-de-sac. It was necessary to proceed with endovaginal exam following the transabdominal exam to visualize the endometrium and ovaries. Color and duplex Doppler ultrasound was utilized  to evaluate blood flow to the ovaries. COMPARISON:  None. FINDINGS: Uterus Measurements: 7.7 x 4 by 5.3 cm. No fibroids or other mass visualized. Endometrium Thickness: 6.7 mm.  No focal abnormality visualized. Right ovary Measurements: 3.1 x 1.7 x 1.8 cm. Normal appearance/no adnexal mass. Left ovary Measurements: 3.7 x 2.2 x 2.4 cm. Normal appearance/no adnexal mass. Pulsed Doppler evaluation of both ovaries demonstrates normal low-resistance arterial and venous waveforms. Other findings No abnormal free fluid. IMPRESSION: Negative pelvic ultrasound.  No evidence for ovarian torsion. Electronically Signed   By: Jasmine Pang M.D.   On: 03/05/2017 21:30   US Pelvis (transabdominal Only)  Result Date: 03/05/2017 CLINICAL DATA:  Pelvic pain EXAM: TRANSABDOMINAL AND TRANSVAGINAL ULTRASOUND OF PELVIS DOPPLER ULTRASOUND OF OVARIES TECHNIQUE: Both transabdominal and transvaginal ultrasound examinations of the pelvis were performed. Transabdominal technique was performed for global imaging of the pelvis including uterus, ovaries, adnexal regions, and pelvic cul-de-sac. It was necessary to proceed with endovaginal exam following the transabdominal exam to visualize the endometrium and ovaries. Color and duplex Doppler ultrasound was utilized to evaluate blood flow to the ovaries. COMPARISON:  None. FINDINGS: Uterus Measurements: 7.7 x 4 by 5.3 cm. No fibroids or other mass visualized. Endometrium Thickness: 6.7 mm.  No focal abnormality visualized. Right ovary Measurements: 3.1 x 1.7 x 1.8 cm. Normal appearance/no adnexal mass. Left ovary Measurements: 3.7 x 2.2 x 2.4 cm. Normal appearance/no adnexal mass. Pulsed Doppler evaluation of both ovaries demonstrates normal low-resistance arterial and venous waveforms. Other findings No abnormal free fluid. IMPRESSION: Negative pelvic ultrasound.  No evidence for ovarian torsion. Electronically Signed   By: Jasmine Pang M.D.   On: 03/05/2017 21:30   US Pelvic Doppler  (torsion R/o Or Mass Arterial Flow)  Result Date: 03/05/2017 CLINICAL DATA:  Pelvic pain EXAM: TRANSABDOMINAL AND TRANSVAGINAL ULTRASOUND OF PELVIS DOPPLER ULTRASOUND OF OVARIES TECHNIQUE: Both transabdominal and transvaginal ultrasound examinations of the pelvis were performed. Transabdominal technique was performed for global imaging of the pelvis including uterus, ovaries, adnexal regions, and pelvic cul-de-sac. It was necessary to proceed with endovaginal exam following the transabdominal exam to visualize the endometrium and ovaries. Color and duplex Doppler ultrasound was utilized to evaluate blood flow to the ovaries. COMPARISON:  None. FINDINGS: Uterus Measurements: 7.7 x 4 by 5.3 cm. No fibroids or other mass visualized. Endometrium Thickness: 6.7 mm.  No focal abnormality visualized. Right ovary Measurements: 3.1 x 1.7 x 1.8 cm. Normal appearance/no adnexal mass. Left  ovary Measurements: 3.7 x 2.2 x 2.4 cm. Normal appearance/no adnexal mass. Pulsed Doppler evaluation of both ovaries demonstrates normal low-resistance arterial and venous waveforms. Other findings No abnormal free fluid. IMPRESSION: Negative pelvic ultrasound.  No evidence for ovarian torsion. Electronically Signed   By: Jasmine Pang M.D.   On: 03/05/2017 21:30    Procedures Procedures (including critical care time)  Medications Ordered in ED Medications  sodium chloride 0.9 % bolus 1,000 mL (0 mLs Intravenous Stopped 03/05/17 2135)  morphine 4 MG/ML injection 4 mg (4 mg Intravenous Given 03/05/17 1924)  azithromycin (ZITHROMAX) tablet 1,000 mg (1,000 mg Oral Given 03/05/17 2051)    And  metroNIDAZOLE (FLAGYL) tablet 2,000 mg (2,000 mg Oral Given 03/05/17 2051)  cefTRIAXone (ROCEPHIN) 1 g in dextrose 5 % 50 mL IVPB (0 g Intravenous Stopped 03/05/17 2135)  ketorolac (TORADOL) 30 MG/ML injection 30 mg (30 mg Intravenous Given 03/05/17 2148)     Initial Impression / Assessment and Plan / ED Course  I have reviewed the triage  vital signs and the nursing notes.  Pertinent labs & imaging results that were available during my care of the patient were reviewed by me and considered in my medical decision making (see chart for details).     27 y.o. female here with multiple complaints; primarily complains of lower abd pain x2 days with associated diarrhea, vaginal discharge, malodorous urine, and dysuria. Secondarily complains of cough and rhinorrhea. On exam, diffuse abdominal tenderness, lower>upper, no specific area of discreet tenderness, just diffusely tender throughout, but nonperitoneal. No flank tenderness. Neg mcburney's point focal tenderness, neg murphy's exam. Afebrile and nontoxic. Clear lung exam. Work up thus far reveals: betaHCG neg; trop neg; lipase WNL; CMP WNL; CBC with mildly elevated WBC 14.2 and mild improving anemia; CXR neg; EKG poor quality but other than Q waves in V2, no other acute ischemic findings; U/A pending. Hard to say there's one specific etiology to all of her symptoms, but will first proceed with pelvic exam to decide if/what imaging may be needed; will also get STD panel, add-on differential count, and give fluids and pain meds, then reassess shortly.   8:08 PM U/A without evidence of UTI. Differential with slight neutrophilic predominance. Pelvic exam reveals copious amount of purulent discharge in vaginal vault, from the cervix, with +CMT and b/l adnexal tenderness. Will empirically cover for GC/CT/Trich, and proceed with pelvic U/S. Will reassess shortly.   9:43 PM Pt still having pain after U/S, will give toradol now before discharge. Wet prep with +clue cells and +WBCs, will treat for BV as well as the empiric GC/CT/Trich treatment. Pelvic U/S negative for acute findings. Symptoms most likely related to PID, diarrhea could be from peritoneal irritation from this, or could be due to a viral illness. Doubt need for further emergent work up at this time. Will treat for PID and BV. Discussed  abstinence x14 days until treatment is complete. F/up with health dept for future STD concerns. Safe sex encouraged, and discussed having partners tested and treated before re-engaging in intercourse after the 14 day abstinence period. Advised BRAT diet to help with diarrhea. Discussed OTC remedies for symptomatic relief of diarrhea, pelvic pain, and URI symptoms. Tobacco cessation strongly encouraged. F/up with PCP in 1wk for recheck of symptoms. [facility]worsening symptoms. I explained the diagnosis and have given explicit precautions to return to the ER including for any other new or worsening symptoms. The patient understands and accepts the medical plan  as it's been dictated and I have answered their questions. Discharge instructions concerning home care and prescriptions have been given. The patient is STABLE and is discharged to home in good condition.    Final Clinical Impressions(s) / ED Diagnoses   Final diagnoses:  Pelvic pain  Lower abdominal pain  PID (acute pelvic inflammatory disease)  Vaginal discharge  Diarrhea, unspecified type  Upper respiratory tract infection, unspecified type  Cough  Neutrophilic leukocytosis  Tobacco user  BV (bacterial vaginosis)    ED Discharge Orders        Ordered    metroNIDAZOLE (FLAGYL) 500 MG tablet  2 times daily     03/05/17 2136    doxycycline (VIBRAMYCIN) 100 MG capsule  2 times daily     03/05/17 [address] [place], [place] 03/05/17 2150    Lorre Nick, MD 03/06/17 1601

## 2017-03-05 NOTE — ED Triage Notes (Signed)
Pt reports midline lower abd pain that has gradually spread around to bilateral flanks over the past few days. No n/v. Small amount of diarrhea. Also has dysuria. Pt also reports R side CP with breathing. Had a cough at home.

## 2017-03-05 NOTE — Discharge Instructions (Signed)
Your work up today has revealed that you have bacterial vaginosis, take flagyl as directed until completed, starting tomorrow 03/06/17. DO NOT DRINK ALCOHOL WHILE TAKING THIS MEDICATION.   You are being treated for pelvic inflammatory disease, which is due to infection/inflammation in the pelvic organs. Take doxycycline as directed until completed. You have been treated for gonorrhea, chlamydia, and trichomonas in the ER but the hospital will call you if lab is positive. You were tested for HIV and Syphilis, and the hospital will call you if the lab is positive. NO SEXUAL INTERCOURSE FOR AT LEAST 14 DAYS AFTER TODAY'S VISIT, THIS WILL INVALIDATE YOUR TREATMENT HERE. DO NOT ENGAGE IN SEXUAL ACTIVITY UNTIL YOU FIND OUT ABOUT YOUR RESULTS AND HAVE PARTNERS TESTED AND TREATED. ALL PARTNERS MUST BE TESTED AND TREATED FOR STD'S. ALWAYS USE CONDOMS WHEN ENGAGING IN INTERCOURSE. [facility] for future STD concerns or screenings. This is the recommendation by the CDC for people with multiple sexual partners or history of STDs.  For your diarrhea: May consider using over the counter tums, maalox, pepto bismol, or other over the counter remedies to help with symptoms. Stay well hydrated with small sips of fluids throughout the day. Follow a BRAT (banana-rice-applesauce-toast) diet as described below for the next 24-48 hours. The 'BRAT' diet is suggested, then progress to diet as tolerated as symptoms abate. Call your regular doctor if bloody stools, persistent diarrhea, vomiting, fever or abdominal pain.    For your cough/cold symptoms: Continue to stay well-hydrated. Continue to alternate between Tylenol and Ibuprofen for pain or fever. Use Mucinex for cough suppression/expectoration of mucus. Use netipot and flonase to help with nasal congestion. May consider over-the-counter Benadryl or other antihistamine to decrease secretions and for help with your symptoms. STOP  SMOKING!    Follow up with your regular doctor in 1 week for recheck of symptoms. [facility] (called [facility]) for any changes or worsening symptoms.

## 2017-03-06 LAB — RPR: RPR Ser Ql: NONREACTIVE

## 2017-03-06 LAB — HIV ANTIBODY (ROUTINE TESTING W REFLEX): HIV Screen 4th Generation wRfx: NONREACTIVE

## 2017-03-07 LAB — GC/CHLAMYDIA PROBE AMP (~~LOC~~) NOT AT ARMC
Chlamydia: NEGATIVE
Neisseria Gonorrhea: POSITIVE — AB

## 2018-05-17 IMAGING — CR DG CHEST 2V
2 series · 2 of 2 positions shown · non-contrast
Comparison: 07/13/2007.

CLINICAL DATA: Midline lower abdominal pain gradually spreading
around to the flanks over the past few days.

EXAM:
CHEST  2 VIEW

[w chest pa]
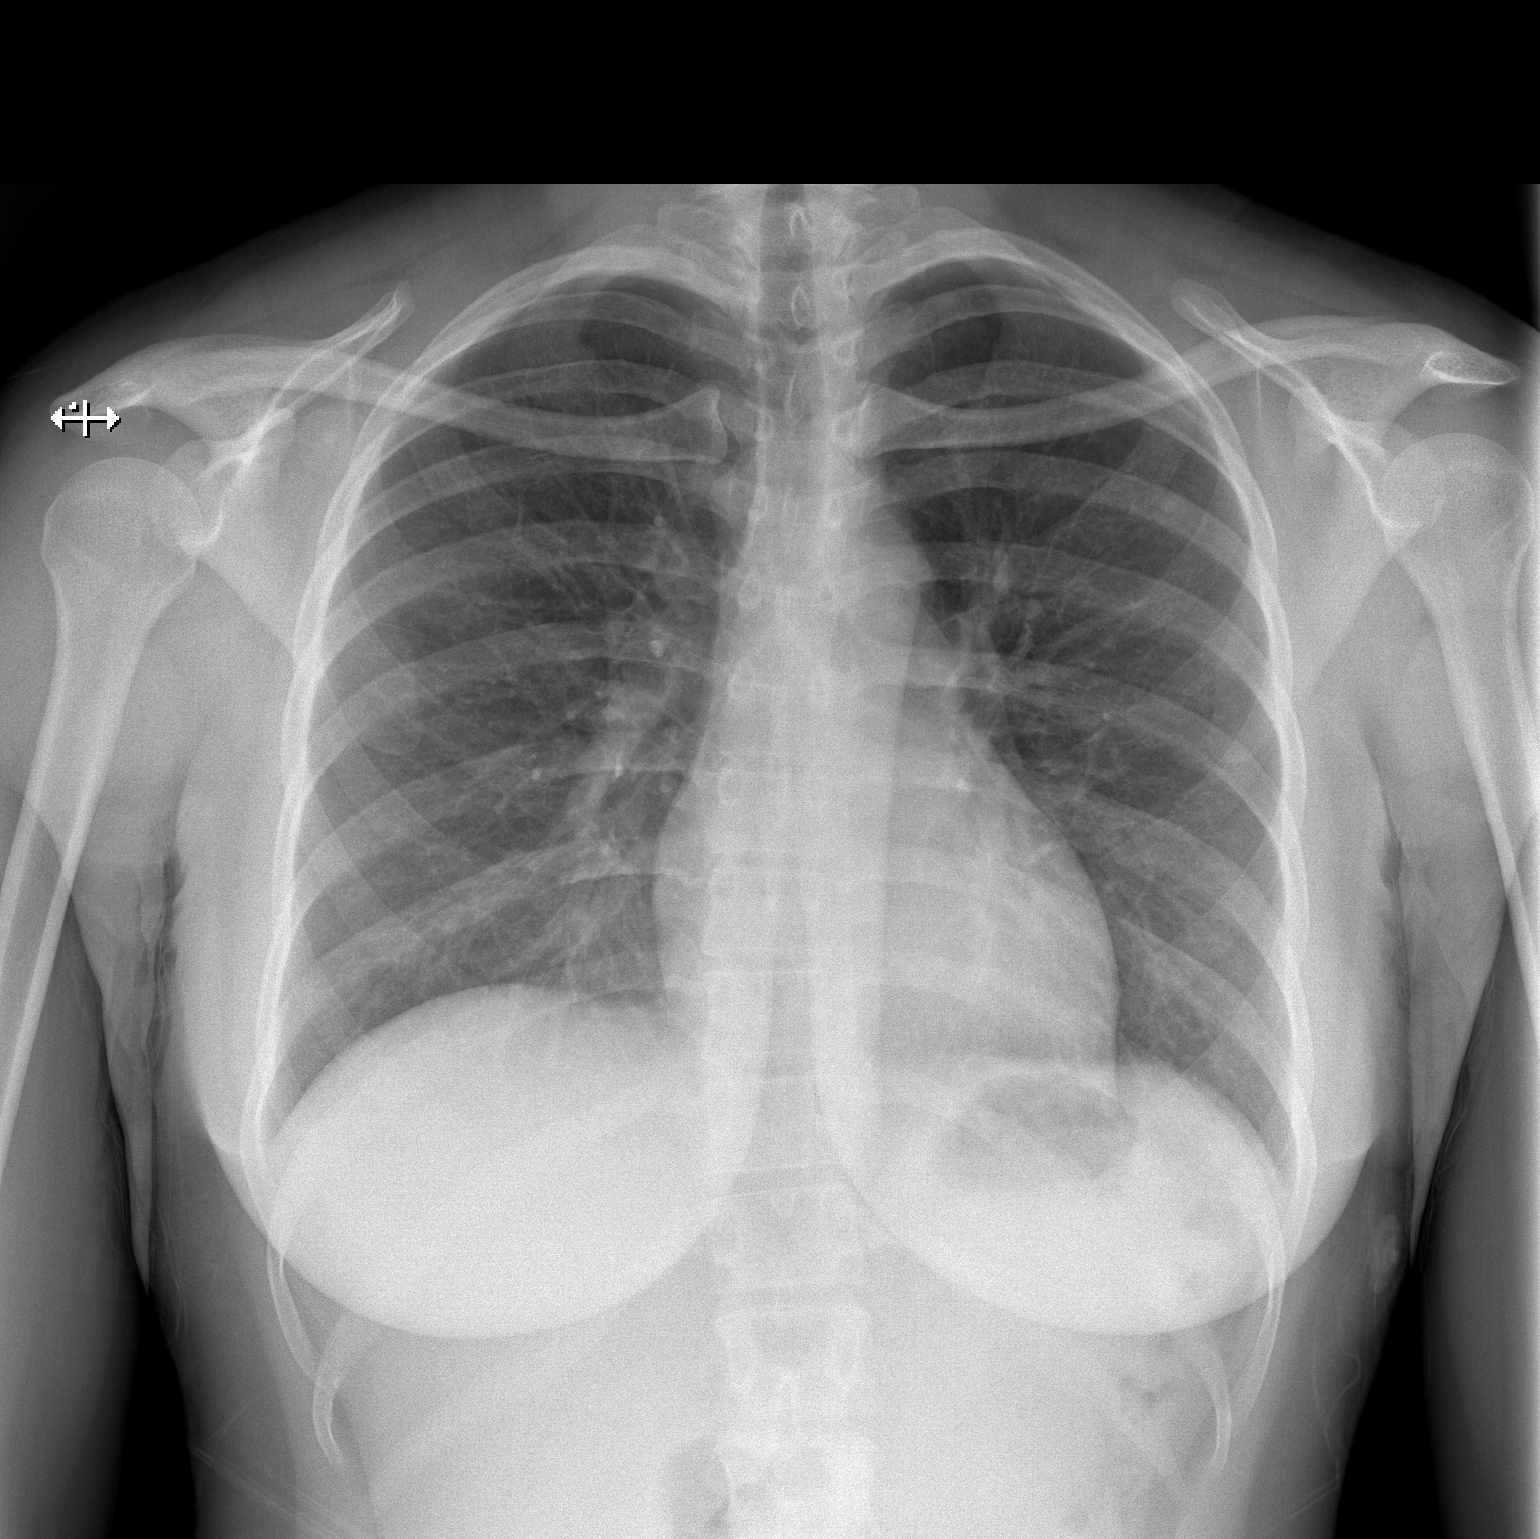

[w chest lat]
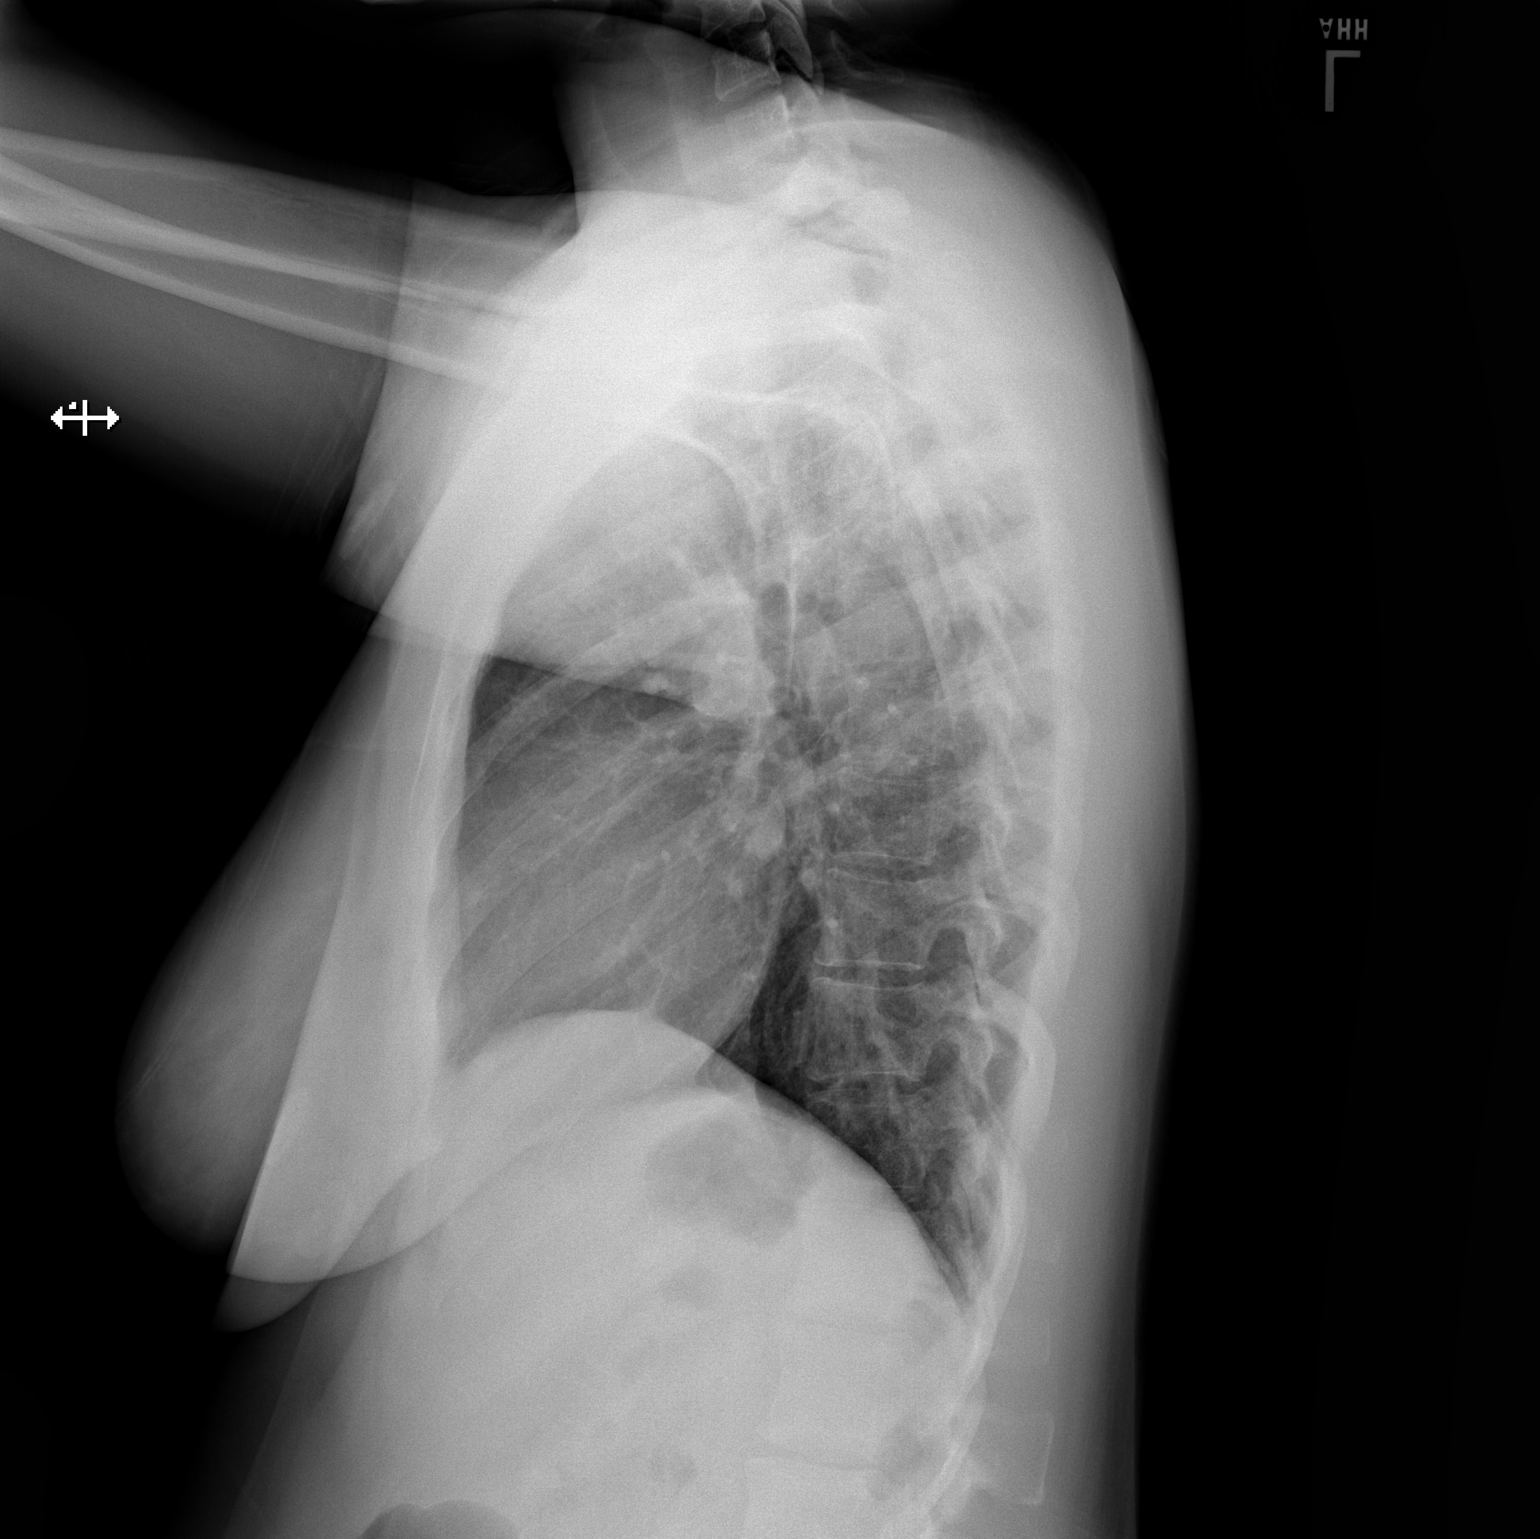

[2 of 2 positions shown; findings below may reference images not displayed]

FINDINGS: Normal sized heart.  Clear lungs.  Mild scoliosis.
IMPRESSION: No acute abnormality.

## 2018-09-14 ENCOUNTER — Other Ambulatory Visit: Payer: Self-pay

## 2018-09-14 ENCOUNTER — Emergency Department (HOSPITAL_COMMUNITY)
Admission: EM | Admit: 2018-09-14 | Discharge: 2018-09-14 | Disposition: A | Discharge status: Home / Self Care | Payer: Self-pay | Attending: Emergency Medicine | Admitting: Emergency Medicine

## 2018-09-14 ENCOUNTER — Encounter (HOSPITAL_COMMUNITY): Payer: Self-pay | Admitting: Emergency Medicine

## 2018-09-14 DIAGNOSIS — B349 Viral infection, unspecified: Secondary | ICD-10-CM | POA: Insufficient documentation

## 2018-09-14 DIAGNOSIS — Z20828 Contact with and (suspected) exposure to other viral communicable diseases: Secondary | ICD-10-CM | POA: Insufficient documentation

## 2018-09-14 DIAGNOSIS — Z87891 Personal history of nicotine dependence: Secondary | ICD-10-CM | POA: Insufficient documentation

## 2018-09-14 LAB — GROUP A STREP BY PCR: Group A Strep by PCR: NOT DETECTED

## 2018-09-14 NOTE — Discharge Instructions (Signed)
Please stay home and self isolate until you receive results. If positive you will need to stay home for 10 days starting today If negative you can return to your normal activities  Monitor your symptoms at home Return to the ED for any worsening symptoms

## 2018-09-14 NOTE — ED Triage Notes (Signed)
Pt c/o generalize body aches for x3 days denies fever. Pt states shes been more tired, headaches, and sore throat.

## 2018-09-14 NOTE — ED Provider Notes (Signed)
[facility] EMERGENCY DEPARTMENT Provider Note   CSN: [id number] Arrival date & time: 09/14/18  1844    History   Chief Complaint Chief Complaint  Patient presents with  . Generalized Body Aches    HPI Cassandra Berg is a 28 y.o. female who presents to the ED today complaining of gradual onset, constant, generalized fatigue, body aches, and sore throat x 2 days. Pt also endorses a mild headache. She has not been taking anything for her symptoms. She recently started working in a [employer], [employer] and states masks are "optional." No known recent covid 19 exposure. Denies fevers, chills, nausea, vomiting, neck stiffness, rash, or any other associated symptoms.        Past Medical History:  Diagnosis Date  . Anemia   . Vaginal Pap smear, abnormal     Patient Active Problem List   Diagnosis Date Noted  . NSVD (normal spontaneous vaginal delivery) 09/09/2016  . Status post tubal ligation at time of delivery, current hospitalization 09/09/2016  . Leakage, amniotic fluid 09/08/2016  . Chlamydia infection complicating pregnancy, second trimester 06/21/2016  . Anemia in pregnancy 06/18/2016  . Supervision of normal pregnancy 06/17/2016  . Late prenatal care affecting pregnancy in second trimester 06/17/2016  . Short interval between pregnancies affecting pregnancy, antepartum 06/17/2016  . 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 History of shoulder dystocia in prior pregnancy 02/26/2015    Past Surgical History:  Procedure Laterality Date  . TUBAL LIGATION Bilateral 09/08/2016   Procedure: POST PARTUM TUBAL LIGATION;  Surgeon: Federico FlakeNewton, Kimberly Niles, MD;  [facility];  Service: Gynecology;  Laterality: Bilateral;  . WISDOM TOOTH EXTRACTION       OB History    Gravida  3   Para  3   Term  3   Preterm      AB      Living  3     SAB      TAB      Ectopic      Multiple  0   Live Births  3            Home Medications    Prior to Admission medications    Medication Sig Start Date End Date Taking? Authorizing Provider  acetaminophen (TYLENOL) 500 MG tablet Take 500 mg by mouth every 6 (six) hours as needed for mild pain.    [provider]  amoxicillin (AMOXIL) 500 MG capsule Take 1 capsule (500 mg total) by mouth 3 (three) times daily. Patient not taking: Reported on 03/05/2017 02/04/17   Triplett, Babette Relicammy, PA-C  ibuprofen (ADVIL,MOTRIN) 600 MG tablet Take 1 tablet (600 mg total) by mouth every 6 (six) hours as needed. Take with food Patient not taking: Reported on 03/05/2017 02/04/17   Triplett, Tammy, PA-C  metroNIDAZOLE (FLAGYL) 500 MG tablet Take 1 tablet (500 mg total) by mouth 2 (two) times daily. One po bid x 7 days STARTING 03/06/17 03/05/17   Street, FlorenceMercedes, PA-C  oxyCODONE-acetaminophen (ROXICET) 5-325 MG tablet Take 1-2 tablets by mouth every 4 (four) hours as needed for severe pain. Patient not taking: Reported on 03/05/2017 09/09/16   Michaele OfferMumaw, Elizabeth Woodland, DO  Prenat w/o A-FeCbGl-DSS-FA-DHA [facility](CITRANATAL ASSURE) 35-1 & 300 MG tablet One tablet and one capsule daily Patient not taking: Reported on 03/05/2017 06/03/16   Adline PotterGriffin, Jennifer A, NP    Family History Family History  Problem Relation Age of Onset  . Heart disease Mother   . Stroke Mother   . Hypertension Mother   .  Diabetes Father   . Hyperlipidemia Father   . Cancer Maternal Grandmother        lung  . Diabetes Paternal Grandmother     Social History Social History   Tobacco Use  . Smoking status: Former Smoker    Packs/day: 0.25    Types: Cigarettes  . Smokeless tobacco: Never Used  Substance Use Topics  . Alcohol use: No    Comment: socially  . Drug use: No     Allergies   Patient has no known allergies.   Review of Systems Review of Systems  Constitutional: Negative for chills and fever.  HENT: Positive for sore throat. Negative for congestion, ear discharge, ear pain, rhinorrhea, sinus pressure and sinus pain.   Eyes: Negative for visual  disturbance.  Respiratory: Negative for cough and shortness of breath.   Cardiovascular: Negative for chest pain.  Gastrointestinal: Negative for abdominal pain, nausea and vomiting.  Genitourinary: Negative for dysuria.  Musculoskeletal: Positive for myalgias.  Skin: Negative for rash.  Neurological: Positive for headaches. Negative for dizziness, syncope, weakness, light-headedness and numbness.     Physical Exam Updated Vital Signs BP 127/78 (BP Location: Left Arm)   Pulse 72   Temp 98.3 F (36.8 C) (Oral)   Resp 16   Ht 5\' 6"  (1.676 m)   Wt 63.5 kg   LMP 08/27/2018   SpO2 99%   BMI 22.60 kg/m   Physical Exam Vitals signs and nursing note reviewed.  Constitutional:      Appearance: She is not ill-appearing.  HENT:     Head: Normocephalic and atraumatic.     Right Ear: Tympanic membrane normal.     Left Ear: Tympanic membrane normal.     Nose: Nose normal.     Mouth/Throat:     Comments: Mild erythema noted to posterior oropharynx without exudate; no tonsillar hypertrophy; uvula midline; airway intact Eyes:     Conjunctiva/sclera: Conjunctivae normal.  Neck:     Musculoskeletal: Neck supple. No neck rigidity.  Cardiovascular:     Rate and Rhythm: Normal rate and regular rhythm.     Pulses: Normal pulses.  Pulmonary:     Effort: Pulmonary effort is normal.     Breath sounds: Normal breath sounds. No wheezing, rhonchi or rales.  Abdominal:     Palpations: Abdomen is soft.     Tenderness: There is no abdominal tenderness. There is no guarding or rebound.  Lymphadenopathy:     Cervical: No cervical adenopathy.  Skin:    General: Skin is warm and dry.  Neurological:     Mental Status: She is alert.      ED Treatments / Results  Labs (all labs ordered are listed, but only abnormal results are displayed) Labs Reviewed  GROUP A STREP BY PCR  NOVEL CORONAVIRUS, NAA (HOSPITAL ORDER, SEND-OUT TO REF LAB)    EKG None  Radiology No results found.   Procedures Procedures (including critical care time)  Medications Ordered in ED Medications - No data to display   Initial Impression / Assessment and Plan / ED Course  I have reviewed the triage vital signs and the nursing notes.  Pertinent labs & imaging results that were available during my care of the patient were reviewed by me and considered in my medical decision making (see chart for details).    28 year old otherwise healthy female who presents with URI like symptoms. Recently started working at [employer]; no known covid 19 exposures. Posterior oropharynx slightly erythematous  without exudate. Strep test and covid test obtained in triage prior to being seen. Do not feel patient needs further workup; she has not been coughing. She is afebrile in the ED without tachycardia or tachypnea. No chest xray obtained today. Will await results of strep test and advise patient to self isolate until she recieves covid test results.   Strep negative today. Will have patient stay at home and self isolate until she receives covid results. Work note given for pt. She is in agreement with plan and stable for discharge home.        Final Clinical Impressions(s) / ED Diagnoses   Final diagnoses:  Viral illness    ED Discharge Orders    None       Tanda RockersVenter, Shoshannah Faubert, Cordelia Poche-C 09/14/18 2201    Eber HongMiller, Brian, MD 09/16/18 1327

## 2018-09-16 LAB — NOVEL CORONAVIRUS, NAA (HOSP ORDER, SEND-OUT TO REF LAB; TAT 18-24 HRS): SARS-CoV-2, NAA: NOT DETECTED

## 2018-09-25 ENCOUNTER — Telehealth: Payer: Self-pay

## 2018-09-25 NOTE — Telephone Encounter (Signed)
Patient called in to get the results of her Covid-19 test.  She was told Covid was Not Detected.

## 2019-01-31 IMAGING — US US PELVIS COMPLETE
1 series · 14 of 25 positions shown · non-contrast
Comparison: None.

CLINICAL DATA: Pelvic pain

EXAM:
TRANSABDOMINAL AND TRANSVAGINAL ULTRASOUND OF PELVIS
DOPPLER ULTRASOUND OF OVARIES
TECHNIQUE: Both transabdominal and transvaginal ultrasound examinations of the
pelvis were performed. Transabdominal technique was performed for
global imaging of the pelvis including uterus, ovaries, adnexal
regions, and pelvic cul-de-sac.
It was necessary to proceed with endovaginal exam following the
transabdominal exam to visualize the endometrium and ovaries. Color
and duplex Doppler ultrasound was utilized to evaluate blood flow to
the ovaries.

[Series 1: us pelvis complete · 0.22mm/px · 14 of 97 slices shown]
[im 1/97]
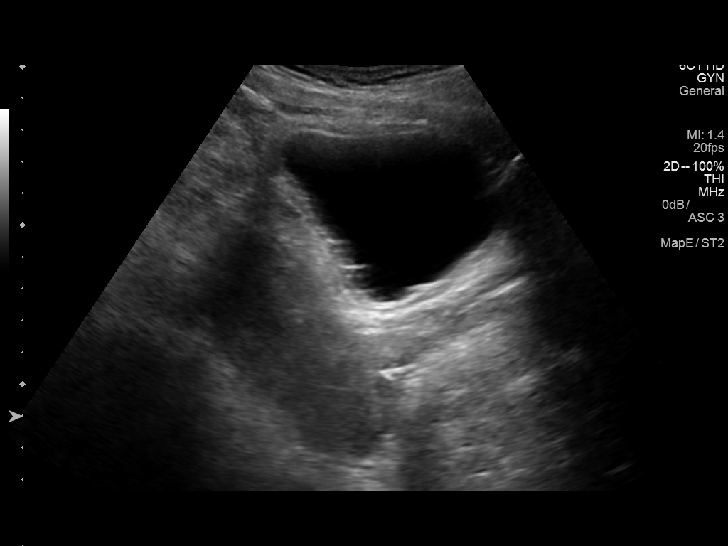
[im 9/97]
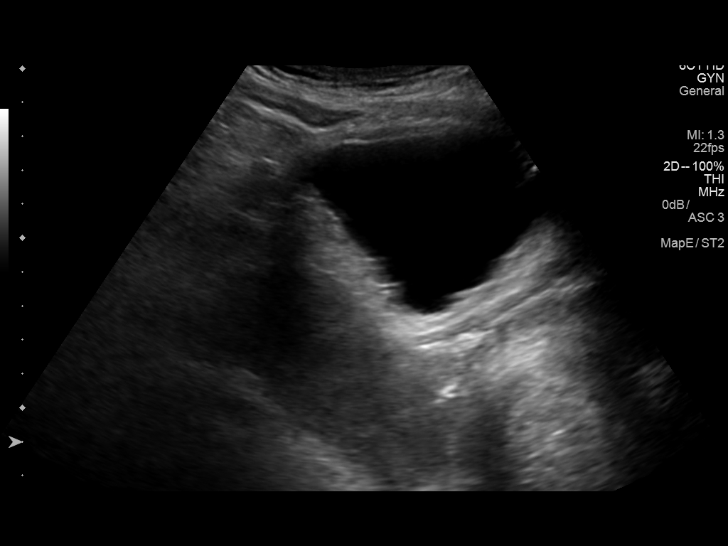
[im 17/97]
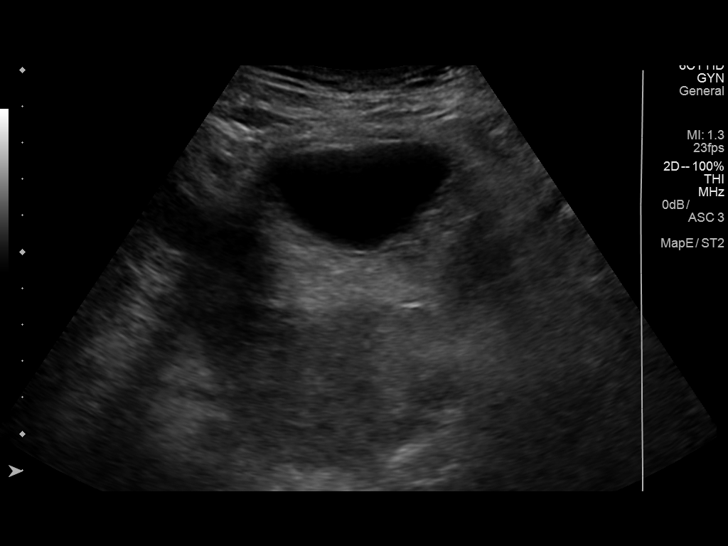
[im 25/97]
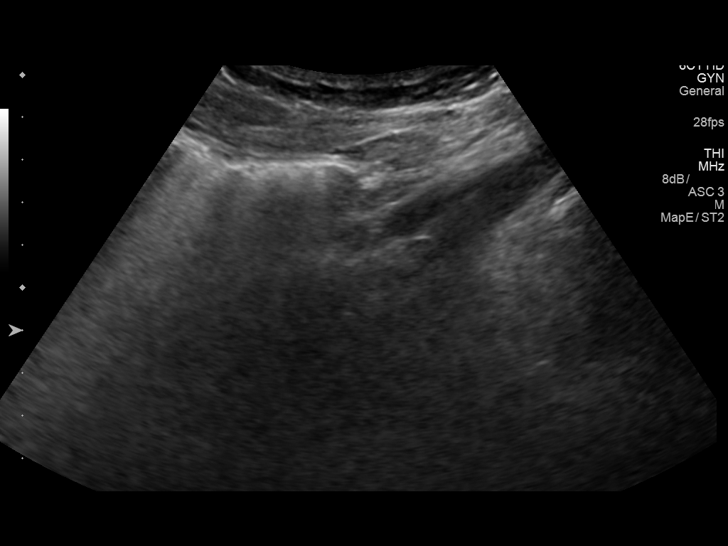
[im 33/97]
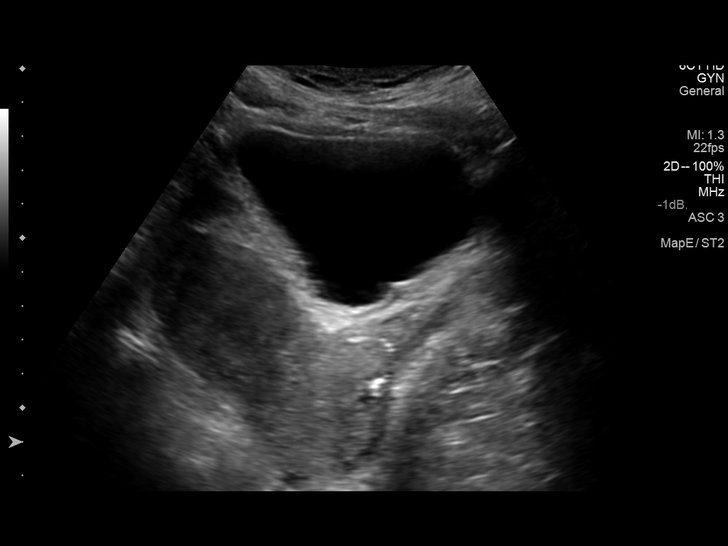
[im 37/97]
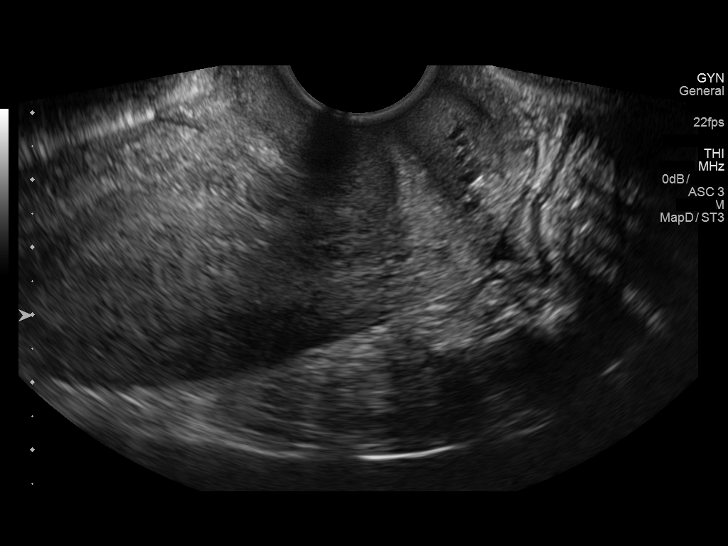
[im 45/97]
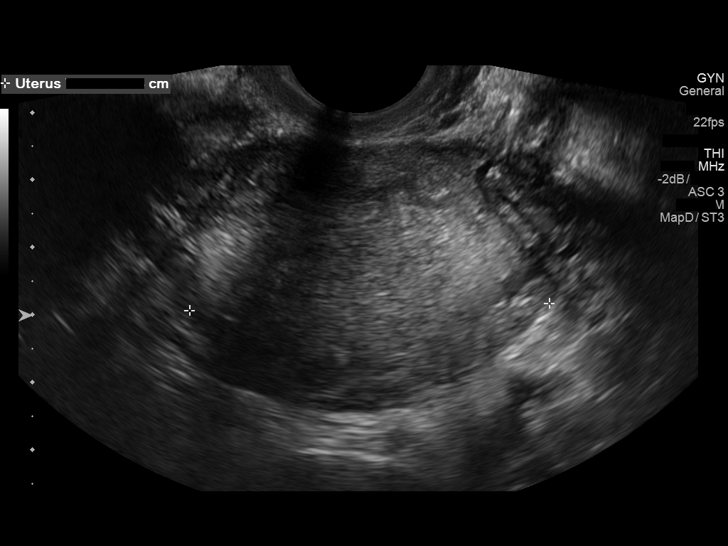
[im 53/97]
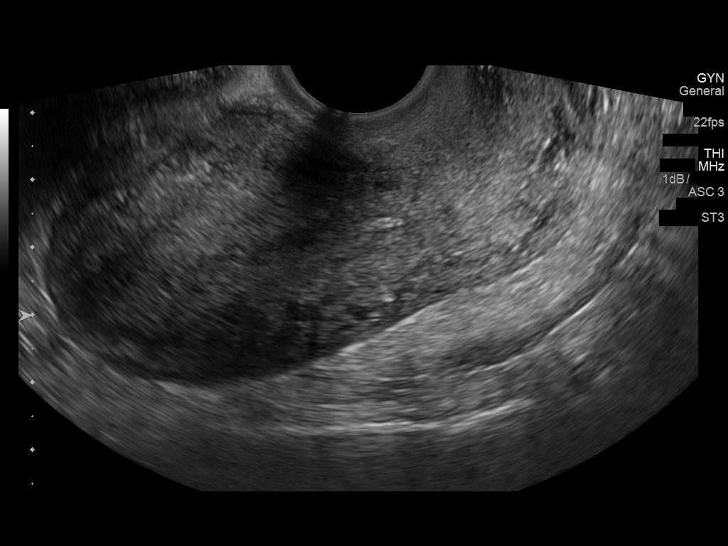
[im 61/97]
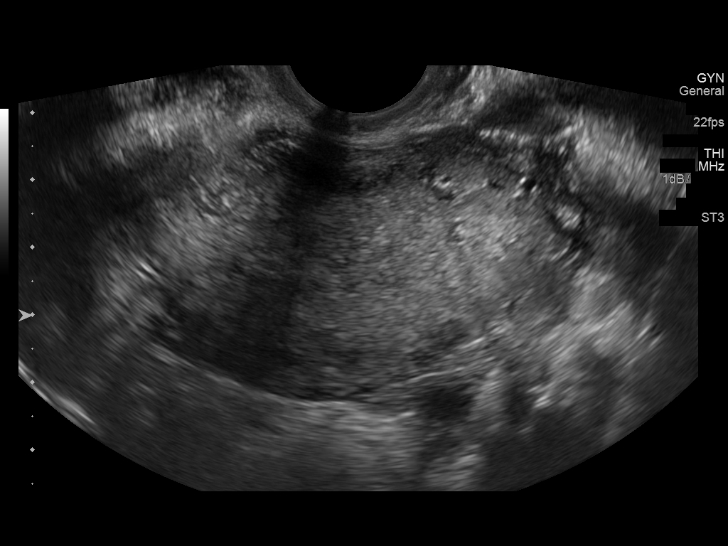
[im 65/97]
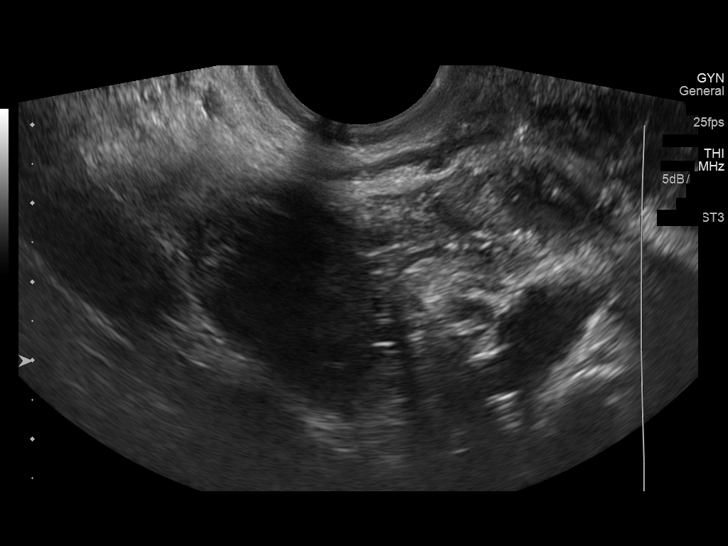
[im 73/97]
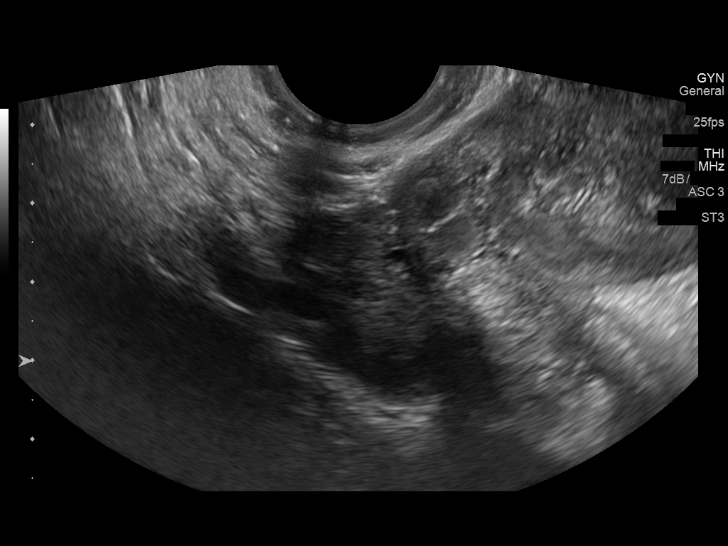
[im 81/97]
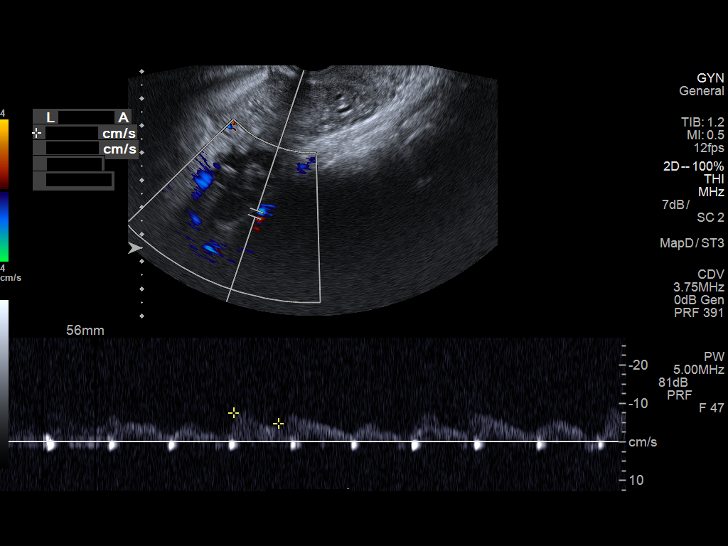
[im 89/97]
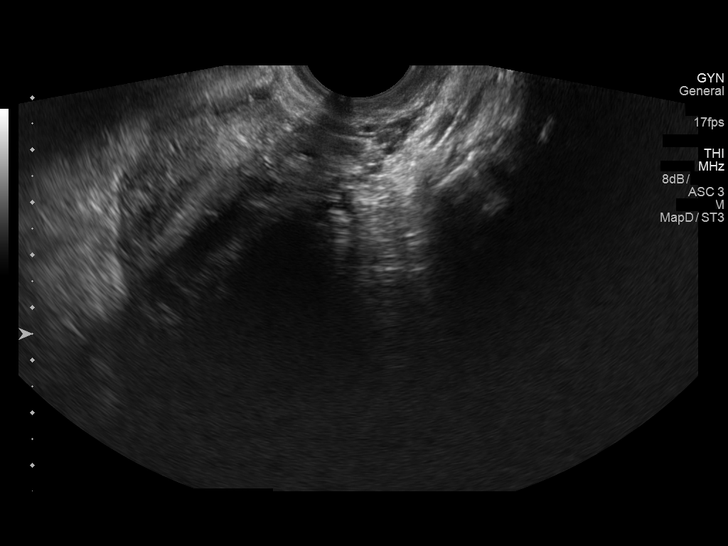
[im 97/97]
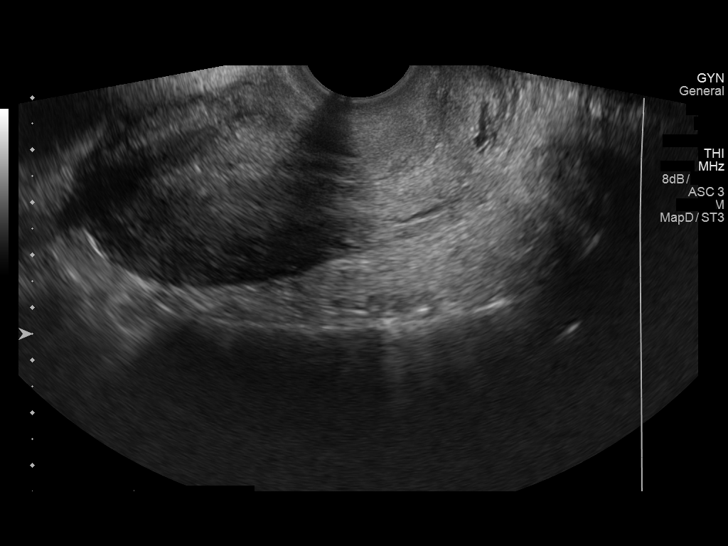

[14 of 25 positions shown; findings below may reference images not displayed]

FINDINGS: Uterus

Measurements: 7.7 x 4 by 5.3 cm. No fibroids or other mass
visualized.

Endometrium

Thickness: 6.7 mm.  No focal abnormality visualized.

Right ovary

Measurements: 3.1 x 1.7 x 1.8 cm. Normal appearance/no adnexal mass.

Left ovary

Measurements: 3.7 x 2.2 x 2.4 cm. Normal appearance/no adnexal mass.

Pulsed Doppler evaluation of both ovaries demonstrates normal
low-resistance arterial and venous waveforms.

Other findings

No abnormal free fluid.
IMPRESSION: Negative pelvic ultrasound.  No evidence for ovarian torsion.

## 2019-03-09 ENCOUNTER — Ambulatory Visit: Payer: Self-pay | Attending: Internal Medicine

## 2019-03-09 ENCOUNTER — Other Ambulatory Visit: Payer: Self-pay

## 2019-03-09 DIAGNOSIS — Z20822 Contact with and (suspected) exposure to covid-19: Secondary | ICD-10-CM | POA: Insufficient documentation

## 2019-03-10 LAB — NOVEL CORONAVIRUS, NAA: SARS-CoV-2, NAA: NOT DETECTED

## 2019-05-14 ENCOUNTER — Other Ambulatory Visit: Payer: Self-pay

## 2019-05-14 DIAGNOSIS — R110 Nausea: Secondary | ICD-10-CM | POA: Insufficient documentation

## 2019-05-14 DIAGNOSIS — F17210 Nicotine dependence, cigarettes, uncomplicated: Secondary | ICD-10-CM | POA: Insufficient documentation

## 2019-05-14 DIAGNOSIS — R197 Diarrhea, unspecified: Secondary | ICD-10-CM | POA: Insufficient documentation

## 2019-05-14 DIAGNOSIS — B349 Viral infection, unspecified: Secondary | ICD-10-CM | POA: Insufficient documentation

## 2019-05-15 ENCOUNTER — Other Ambulatory Visit: Payer: Self-pay

## 2019-05-15 ENCOUNTER — Encounter (HOSPITAL_COMMUNITY): Payer: Self-pay | Admitting: Emergency Medicine

## 2019-05-15 ENCOUNTER — Emergency Department (HOSPITAL_COMMUNITY)
Admission: EM | Admit: 2019-05-15 | Discharge: 2019-05-15 | Disposition: A | Discharge status: Home / Self Care | Payer: Self-pay | Attending: Emergency Medicine | Admitting: Emergency Medicine

## 2019-05-15 DIAGNOSIS — B349 Viral infection, unspecified: Secondary | ICD-10-CM

## 2019-05-15 MED ORDER — KETOROLAC TROMETHAMINE 60 MG/2ML IM SOLN
30.0000 mg | Freq: Once | INTRAMUSCULAR | Status: AC
  Administered 2019-05-15: 01:00:00 30 mg via INTRAMUSCULAR
  Filled 2019-05-15: qty 2

## 2019-05-15 MED ORDER — PROMETHAZINE HCL 25 MG/ML IJ SOLN
25.0000 mg | Freq: Once | INTRAMUSCULAR | Status: AC
Start: 2019-05-15 — End: 2019-05-15
  Administered 2019-05-15: 25 mg via INTRAMUSCULAR
  Filled 2019-05-15: qty 1

## 2019-05-15 NOTE — ED Triage Notes (Signed)
Pt c/o headache with diarrhea since Saturday.

## 2019-05-15 NOTE — ED Provider Notes (Signed)
[facility] EMERGENCY DEPARTMENT Provider Note   CSN: [id number] Arrival date & time: 05/14/19  2319     History Chief Complaint  Patient presents with  . Headache    Cassandra Berg is a 29 y.o. female.  Patient presents to the emergency part for evaluation of headache, diarrhea.  Symptoms present for couple of days.  Patient presents with 2 of her close contacts that have identical symptoms.  No fever.  No abdominal pain.        Past Medical History:  Diagnosis Date  . Anemia   . Vaginal Pap smear, abnormal     Patient Active Problem List   Diagnosis Date Noted  . NSVD (normal spontaneous vaginal delivery) 09/09/2016  . Status post tubal ligation at time of delivery, current hospitalization 09/09/2016  . Leakage, amniotic fluid 09/08/2016  . Chlamydia infection complicating pregnancy, second trimester 06/21/2016  . Anemia in pregnancy 06/18/2016  . Supervision of normal pregnancy 06/17/2016  . Late prenatal care affecting pregnancy in second trimester 06/17/2016  . Short interval between pregnancies affecting pregnancy, antepartum 06/17/2016  . 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 History of shoulder dystocia in prior pregnancy 02/26/2015    Past Surgical History:  Procedure Laterality Date  . TUBAL LIGATION Bilateral 09/08/2016   Procedure: POST PARTUM TUBAL LIGATION;  Surgeon: Caren Macadam, MD;  Location: [place];  Service: Gynecology;  Laterality: Bilateral;  . WISDOM TOOTH EXTRACTION       OB History    Gravida  3   Para  3   Term  3   Preterm      AB      Living  3     SAB      TAB      Ectopic      Multiple  0   Live Births  3           Family History  Problem Relation Age of Onset  . Heart disease Mother   . Stroke Mother   . Hypertension Mother   . Diabetes Father   . Hyperlipidemia Father   . Cancer Maternal Grandmother        lung  . Diabetes Paternal Grandmother     Social History   Tobacco Use  .  Smoking status: Current Every Day Smoker    Packs/day: 0.25  . Smokeless tobacco: Never Used  Substance Use Topics  . Alcohol use: No    Comment: socially  . Drug use: No    Home Medications Prior to Admission medications   Medication Sig Start Date End Date Taking? Authorizing Provider  acetaminophen (TYLENOL) 500 MG tablet Take 500 mg by mouth every 6 (six) hours as needed for mild pain.    [provider]  amoxicillin (AMOXIL) 500 MG capsule Take 1 capsule (500 mg total) by mouth 3 (three) times daily. Patient not taking: Reported on 03/05/2017 02/04/17   Triplett, Lynelle Smoke, PA-C  ibuprofen (ADVIL,MOTRIN) 600 MG tablet Take 1 tablet (600 mg total) by mouth every 6 (six) hours as needed. Take with food Patient not taking: Reported on 03/05/2017 02/04/17   Triplett, Tammy, PA-C  metroNIDAZOLE (FLAGYL) 500 MG tablet Take 1 tablet (500 mg total) by mouth 2 (two) times daily. One po bid x 7 days STARTING 03/06/17 03/05/17   Street, [place], PA-C  oxyCODONE-acetaminophen (ROXICET) 5-325 MG tablet Take 1-2 tablets by mouth every 4 (four) hours as needed for severe pain. Patient not taking: Reported on 03/05/2017 09/09/16  Jen Mow [place], [place]  Prenat w/o A-FeCbGl-DSS-FA-DHA [facility] ASSURE) 35-1 & 300 MG tablet One tablet and one capsule daily Patient not taking: Reported on 03/05/2017 06/03/16   Adline Potter, NP    Allergies    Patient has no known allergies.  Review of Systems   Review of Systems  Gastrointestinal: Positive for diarrhea and nausea.  Neurological: Positive for headaches.  All other systems reviewed and are negative.   Physical Exam Updated Vital Signs BP 108/69   Pulse 82   Temp 98 F (36.7 C) (Oral)   Resp 17   Ht 5\' 6"  (1.676 m)   Wt 59 kg   LMP 04/29/2019   SpO2 100%   BMI 20.98 kg/m   Physical Exam Vitals and nursing note reviewed.  Constitutional:      General: She is not in acute distress.    Appearance: Normal appearance. She  is well-developed.  HENT:     Head: Normocephalic and atraumatic.     Right Ear: Hearing normal.     Left Ear: Hearing normal.     Nose: Nose normal.  Eyes:     Conjunctiva/sclera: Conjunctivae normal.     Pupils: Pupils are equal, round, and reactive to light.  Cardiovascular:     Rate and Rhythm: Regular rhythm.     Heart sounds: S1 normal and S2 normal. No murmur. No friction rub. No gallop.   Pulmonary:     Effort: Pulmonary effort is normal. No respiratory distress.     Breath sounds: Normal breath sounds.  Chest:     Chest wall: No tenderness.  Abdominal:     General: Bowel sounds are normal.     Palpations: Abdomen is soft.     Tenderness: There is no abdominal tenderness. There is no guarding or rebound. Negative signs include Murphy's sign and McBurney's sign.     Hernia: No hernia is present.  Musculoskeletal:        General: Normal range of motion.     Cervical back: Normal range of motion and neck supple.  Skin:    General: Skin is warm and dry.     Findings: No rash.  Neurological:     Mental Status: She is alert and oriented to person, place, and time.     GCS: GCS eye subscore is 4. GCS verbal subscore is 5. GCS motor subscore is 6.     Cranial Nerves: No cranial nerve deficit.     Sensory: No sensory deficit.     Coordination: Coordination normal.  Psychiatric:        Speech: Speech normal.        Behavior: Behavior normal.        Thought Content: Thought content normal.     ED Results / Procedures / Treatments   Labs (all labs ordered are listed, but only abnormal results are displayed) Labs Reviewed - No data to display  EKG None  Radiology No results found.  Procedures Procedures (including critical care time)  Medications Ordered in ED Medications  ketorolac (TORADOL) injection 30 mg (has no administration in time range)  promethazine (PHENERGAN) injection 25 mg (has no administration in time range)    ED Course  I have reviewed the  triage vital signs and the nursing notes.  Pertinent labs & imaging results that were available during my care of the patient were reviewed by me and considered in my medical decision making (see chart for details).    MDM Rules/Calculators/A&P  Patient presents to the emergency department for evaluation of headache, diarrhea.  She has had some associated nausea.  Patient presents with symptoms that are identical to 2 other close contacts today.  This is consistent with a viral syndrome.  She appears well.  She has headache but she is afebrile with no neck pain or stiffness.  No signs of meningismus.  No rash.  Abdominal exam is benign.  Treat symptomatically.  Final Clinical Impression(s) / ED Diagnoses Final diagnoses:  Viral syndrome    Rx / DC Orders ED Discharge Orders    None       Jequan Shahin, Canary Brim, MD 05/15/19 [phone]

## 2020-01-13 ENCOUNTER — Encounter (HOSPITAL_COMMUNITY): Payer: Self-pay | Admitting: Emergency Medicine

## 2020-01-13 ENCOUNTER — Other Ambulatory Visit: Payer: Self-pay

## 2020-01-13 ENCOUNTER — Emergency Department (HOSPITAL_COMMUNITY)
Admission: EM | Admit: 2020-01-13 | Discharge: 2020-01-13 | Disposition: A | Discharge status: Home / Self Care | Payer: Self-pay | Attending: Emergency Medicine | Admitting: Emergency Medicine

## 2020-01-13 DIAGNOSIS — N760 Acute vaginitis: Secondary | ICD-10-CM | POA: Insufficient documentation

## 2020-01-13 DIAGNOSIS — F17200 Nicotine dependence, unspecified, uncomplicated: Secondary | ICD-10-CM | POA: Insufficient documentation

## 2020-01-13 DIAGNOSIS — B9689 Other specified bacterial agents as the cause of diseases classified elsewhere: Secondary | ICD-10-CM | POA: Insufficient documentation

## 2020-01-13 DIAGNOSIS — N3001 Acute cystitis with hematuria: Secondary | ICD-10-CM | POA: Insufficient documentation

## 2020-01-13 LAB — URINALYSIS, ROUTINE W REFLEX MICROSCOPIC
Bilirubin Urine: NEGATIVE
Glucose, UA: NEGATIVE mg/dL
Ketones, ur: NEGATIVE mg/dL
Nitrite: NEGATIVE
Protein, ur: 100 mg/dL — AB
RBC / HPF: >50 RBC/hpf — ABNORMAL HIGH (ref 0–5)
Specific Gravity, Urine: 1.019 (ref 1.005–1.030)
WBC, UA: >50 WBC/hpf — ABNORMAL HIGH (ref 0–5)
pH: 6 (ref 5.0–8.0)

## 2020-01-13 LAB — WET PREP, GENITAL
Sperm: NONE SEEN
Trich, Wet Prep: NONE SEEN
Yeast Wet Prep HPF POC: NONE SEEN

## 2020-01-13 LAB — PREGNANCY, URINE: Preg Test, Ur: NEGATIVE

## 2020-01-13 MED ORDER — SULFAMETHOXAZOLE-TRIMETHOPRIM 800-160 MG PO TABS: 1.0000 | ORAL_TABLET | Freq: Two times a day (BID) | ORAL | 0 refills | Status: AC

## 2020-01-13 MED ORDER — METRONIDAZOLE 500 MG PO TABS
500.0000 mg | ORAL_TABLET | Freq: Two times a day (BID) | ORAL | 0 refills | Status: AC
Start: 2020-01-13 — End: ?

## 2020-01-13 NOTE — ED Provider Notes (Signed)
[facility] EMERGENCY DEPARTMENT Provider Note   CSN: [id number] Arrival date & time: 01/13/20  1242     History Chief Complaint  Patient presents with  . Dysuria    Cassandra Berg is a 29 y.o. female presenting with a several day history increased urinary frequency in association with painful urination and pink-tinged urine production.  She also endorses constant vaginal irritation as well.  She denies vaginal discharge.  She also denies low back pain, fevers or chills.  She has had some early morning nausea once or twice since her symptoms began.  She denies risk for STD exposures.  She had a bilateral tubal ligation 3 years ago.  She has been drinking cranberry juice and also purchased a cranberry tablet supplement but has had no improvement in symptoms.  She denies low back pain, no history of kidney stones.  HPI     Past Medical History:  Diagnosis Date  . Anemia   . Vaginal Pap smear, abnormal     Patient Active Problem List   Diagnosis Date Noted  . NSVD (normal spontaneous vaginal delivery) 09/09/2016  . Status post tubal ligation at time of delivery, current hospitalization 09/09/2016  . Leakage, amniotic fluid 09/08/2016  . Chlamydia infection complicating pregnancy, second trimester 06/21/2016  . Anemia in pregnancy 06/18/2016  . Supervision of normal pregnancy 06/17/2016  . Late prenatal care affecting pregnancy in second trimester 06/17/2016  . Short interval between pregnancies affecting pregnancy, antepartum 06/17/2016  . 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 History of shoulder dystocia in prior pregnancy 02/26/2015    Past Surgical History:  Procedure Laterality Date  . TUBAL LIGATION Bilateral 09/08/2016   Procedure: POST PARTUM TUBAL LIGATION;  Surgeon: Federico Flake, MD;  Location: [facility] BIRTHING SUIT[facility];  Service: Gynecology;  Laterality: Bilateral;  . WISDOM TOOTH EXTRACTION       OB History    Gravida  3   Para  3   Term  3   Preterm       AB      Living  3     SAB      TAB      Ectopic      Multiple  0   Live Births  3           Family History  Problem Relation Age of Onset  . Heart disease Mother   . Stroke Mother   . Hypertension Mother   . Diabetes Father   . Hyperlipidemia Father   . Cancer Maternal Grandmother        lung  . Diabetes Paternal Grandmother     Social History   Tobacco Use  . Smoking status: Current Every Day Smoker    Packs/day: 0.25  . Smokeless tobacco: Never Used  Vaping Use  . Vaping Use: Never used  Substance Use Topics  . Alcohol use: No    Comment: socially  . Drug use: No    Home Medications Prior to Admission medications   Medication Sig Start Date End Date Taking? Authorizing Provider  acetaminophen (TYLENOL) 500 MG tablet Take 500 mg by mouth every 6 (six) hours as needed for mild pain.    [provider]  amoxicillin (AMOXIL) 500 MG capsule Take 1 capsule (500 mg total) by mouth 3 (three) times daily. Patient not taking: Reported on 03/05/2017 02/04/17   Triplett, Babette Relic, PA-C  ibuprofen (ADVIL,MOTRIN) 600 MG tablet Take 1 tablet (600 mg total) by mouth every 6 (six) hours as  needed. Take with food Patient not taking: Reported on 03/05/2017 02/04/17   Triplett, Tammy, PA-C  metroNIDAZOLE (FLAGYL) 500 MG tablet Take 1 tablet (500 mg total) by mouth 2 (two) times daily. 01/13/20   Burgess Amor, PA-C  oxyCODONE-acetaminophen (ROXICET) 5-325 MG tablet Take 1-2 tablets by mouth every 4 (four) hours as needed for severe pain. Patient not taking: Reported on 03/05/2017 09/09/16   Michaele Offer, DO  Prenat w/o A-FeCbGl-DSS-FA-DHA [facility] ASSURE) 35-1 & 300 MG tablet One tablet and one capsule daily Patient not taking: Reported on 03/05/2017 06/03/16   Adline Potter, NP  sulfamethoxazole-trimethoprim (BACTRIM DS) 800-160 MG tablet Take 1 tablet by mouth 2 (two) times daily for 10 days. 01/13/20 01/23/20  Burgess Amor, PA-C    Allergies      Patient has no known allergies.  Review of Systems   Review of Systems  Constitutional: Negative for chills and fever.  HENT: Negative.   Eyes: Negative.   Respiratory: Negative for chest tightness and shortness of breath.   Cardiovascular: Negative for chest pain.  Gastrointestinal: Positive for nausea. Negative for abdominal pain and vomiting.  Genitourinary: Positive for dysuria, frequency, hematuria, urgency and vaginal pain. Negative for vaginal discharge.  Musculoskeletal: Negative for arthralgias, back pain, joint swelling and neck pain.  Skin: Negative.  Negative for rash and wound.  Neurological: Negative for dizziness, weakness, light-headedness, numbness and headaches.  Psychiatric/Behavioral: Negative.   All other systems reviewed and are negative.   Physical Exam Updated Vital Signs BP (!) 142/93   Pulse 82   Temp 98.5 F (36.9 C) (Oral)   Resp (!) 21   Ht 5\' 6"  (1.676 m)   Wt 62.6 kg   SpO2 100%   BMI 22.27 kg/m   Physical Exam Vitals and nursing note reviewed. Exam conducted with a chaperone present.  Constitutional:      Appearance: She is well-developed.  HENT:     Head: Normocephalic and atraumatic.  Eyes:     Conjunctiva/sclera: Conjunctivae normal.  Cardiovascular:     Rate and Rhythm: Normal rate and regular rhythm.     Heart sounds: Normal heart sounds.  Pulmonary:     Effort: Pulmonary effort is normal.     Breath sounds: Normal breath sounds. No wheezing.  Abdominal:     General: Bowel sounds are normal.     Palpations: Abdomen is soft.     Tenderness: There is no abdominal tenderness.  Genitourinary:    Vagina: Vaginal discharge present.     Cervix: No cervical motion tenderness.     Uterus: Normal.      Adnexa: Right adnexa normal.     Rectum: Normal.     Comments: Milky vaginal dc. No CMT.  No rash or lesions. Musculoskeletal:        General: Normal range of motion.     Cervical back: Normal range of motion.  Skin:    General:  Skin is warm and dry.  Neurological:     Mental Status: She is alert.     ED Results / Procedures / Treatments   Labs (all labs ordered are listed, but only abnormal results are displayed) Labs Reviewed  WET PREP, GENITAL - Abnormal; Notable for the following components:      Result Value   Clue Cells Wet Prep HPF POC PRESENT (*)    WBC, Wet Prep HPF POC FEW (*)    All other components within normal limits  URINALYSIS, ROUTINE W REFLEX MICROSCOPIC - Abnormal;  Notable for the following components:   APPearance CLOUDY (*)    Hgb urine dipstick MODERATE (*)    Protein, ur 100 (*)    Leukocytes,Ua MODERATE (*)    RBC / HPF >50 (*)    WBC, UA >50 (*)    Bacteria, UA RARE (*)    All other components within normal limits  PREGNANCY, URINE  GC/CHLAMYDIA PROBE AMP ([place]) NOT AT [facility]    EKG None  Radiology No results found.  Procedures Procedures (including critical care time)  Medications Ordered in ED Medications - No data to display  ED Course  I have reviewed the triage vital signs and the nursing notes.  Pertinent labs & imaging results that were available during my care of the patient were reviewed by me and considered in my medical decision making (see chart for details).    MDM Rules/Calculators/A&P                          Patient with bacterial vaginosis, but also with symptoms suggesting acute cystitis including increased urinary frequency, urgency along with increased WBCs and RBCs on her urinalysis.  She was started on Flagyl, also given a 3-day course of Bactrim to cover her for probable UTI.  Return precautions discussed.  Simple UTI suspected, culture not indicated. Final Clinical Impression(s) / ED Diagnoses Final diagnoses:  BV (bacterial vaginosis)  Acute cystitis with hematuria    Rx / DC Orders ED Discharge Orders         Ordered    metroNIDAZOLE (FLAGYL) 500 MG tablet  2 times daily        01/13/20 1427    sulfamethoxazole-trimethoprim  (BACTRIM DS) 800-160 MG tablet  2 times daily        01/13/20 1427           Burgess Amor, PA-C 01/13/20 1430    Mancel Bale, MD 01/14/20 1414

## 2020-01-13 NOTE — ED Triage Notes (Signed)
Pt c./o painful urination with increased frequency for the past few days.

## 2020-01-13 NOTE — Discharge Instructions (Addendum)
You have bacterial vaginosis which could be the sole source of your symptoms  but you are also being given bactrim to treat suspected bladder infection as well.  Take the entire course of both antibiotics.  Get rechecked if not improved with this treatment plan.

## 2020-01-14 LAB — GC/CHLAMYDIA PROBE AMP (~~LOC~~) NOT AT ARMC
Chlamydia: NEGATIVE
Comment: NEGATIVE
Comment: NORMAL
Neisseria Gonorrhea: POSITIVE — AB

## 2020-01-15 ENCOUNTER — Encounter (HOSPITAL_COMMUNITY): Payer: Self-pay | Admitting: *Deleted

## 2020-01-15 ENCOUNTER — Emergency Department (HOSPITAL_COMMUNITY)
Admission: EM | Admit: 2020-01-15 | Discharge: 2020-01-15 | Disposition: A | Discharge status: Home / Self Care | Payer: Self-pay | Attending: Emergency Medicine | Admitting: Emergency Medicine

## 2020-01-15 ENCOUNTER — Other Ambulatory Visit: Payer: Self-pay

## 2020-01-15 DIAGNOSIS — A549 Gonococcal infection, unspecified: Secondary | ICD-10-CM | POA: Insufficient documentation

## 2020-01-15 DIAGNOSIS — F17200 Nicotine dependence, unspecified, uncomplicated: Secondary | ICD-10-CM | POA: Insufficient documentation

## 2020-01-15 DIAGNOSIS — A64 Unspecified sexually transmitted disease: Secondary | ICD-10-CM | POA: Insufficient documentation

## 2020-01-15 LAB — HIV ANTIBODY (ROUTINE TESTING W REFLEX): HIV Screen 4th Generation wRfx: NONREACTIVE

## 2020-01-15 MED ORDER — CEFTRIAXONE SODIUM 500 MG IJ SOLR
500.0000 mg | Freq: Once | INTRAMUSCULAR | Status: AC
  Administered 2020-01-15: 500 mg via INTRAMUSCULAR
  Filled 2020-01-15: qty 500

## 2020-01-15 MED ORDER — DOXYCYCLINE HYCLATE 100 MG PO CAPS: 100.0000 mg | ORAL_CAPSULE | Freq: Two times a day (BID) | ORAL | 0 refills | Status: AC

## 2020-01-15 MED ORDER — LIDOCAINE HCL (PF) 1 % IJ SOLN
1.0000 mL | Freq: Once | INTRAMUSCULAR | Status: AC
  Administered 2020-01-15: 1 mL
  Filled 2020-01-15: qty 30

## 2020-01-15 NOTE — ED Notes (Signed)
ED Provider at bedside. 

## 2020-01-15 NOTE — ED Notes (Signed)
Entered room and introduced self to patient. Pt appears to be sitting in chair with no signs of distress noted. Chair is locked and call light within reach. Respirations are even and unlabored with equal chest rise and fall. All questions and concerns voiced addressed at this time. Educated on call light use and hourly rounding, in agreement at this time.

## 2020-01-15 NOTE — ED Provider Notes (Signed)
[facility] EMERGENCY DEPARTMENT Provider Note   CSN: [id number] Arrival date & time: 01/15/20  1153     History Chief Complaint  Patient presents with  . abnormal lab    Cassandra Berg is a 29 y.o. female.  HPI   Patient presents to the emergency department with chief complaint of testing positive for gonorrhea.  Patient states she was seen in the emergency department on 11/21 where she was complaining of increased urinary frequency, dysuria, pink-tinged urine.  Work-up was notable for possible UTI, positive for bacterial vaginosis. She was started on Flagyl with Bactrim with gonorrhea and chlamydia test pending, unfortunately,  gonorrhea test came back positive and sent for treatment.  She states her symptoms have improved but still endorses occasional dysuria and some vaginal discharge. She states she is tolerating the medication well. She denies abdominal pain, nausea, vomiting, fevers, or chills.  Patient denies denies aggravating factors.  Patient denies headaches, fevers, chills, shortness of breath, chest pain, abdominal pain, nausea vomiting diarrhea.  Past Medical History:  Diagnosis Date  . Anemia   . Vaginal Pap smear, abnormal     Patient Active Problem List   Diagnosis Date Noted  . NSVD (normal spontaneous vaginal delivery) 09/09/2016  . Status post tubal ligation at time of delivery, current hospitalization 09/09/2016  . Leakage, amniotic fluid 09/08/2016  . Chlamydia infection complicating pregnancy, second trimester 06/21/2016  . Anemia in pregnancy 06/18/2016  . Supervision of normal pregnancy 06/17/2016  . Late prenatal care affecting pregnancy in second trimester 06/17/2016  . Short interval between pregnancies affecting pregnancy, antepartum 06/17/2016  . 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 History of shoulder dystocia in prior pregnancy 02/26/2015    Past Surgical History:  Procedure Laterality Date  . TUBAL LIGATION Bilateral 09/08/2016   Procedure: POST  PARTUM TUBAL LIGATION;  Surgeon: Federico Flake, MD;  Location: [facility];  Service: Gynecology;  Laterality: Bilateral;  . WISDOM TOOTH EXTRACTION       OB History    Gravida  3   Para  3   Term  3   Preterm      AB      Living  3     SAB      TAB      Ectopic      Multiple  0   Live Births  3           Family History  Problem Relation Age of Onset  . Heart disease Mother   . Stroke Mother   . Hypertension Mother   . Diabetes Father   . Hyperlipidemia Father   . Cancer Maternal Grandmother        lung  . Diabetes Paternal Grandmother     Social History   Tobacco Use  . Smoking status: Current Every Day Smoker    Packs/day: 0.25  . Smokeless tobacco: Never Used  Vaping Use  . Vaping Use: Never used  Substance Use Topics  . Alcohol use: No    Comment: socially  . Drug use: No    Home Medications Prior to Admission medications   Medication Sig Start Date End Date Taking? Authorizing Provider  acetaminophen (TYLENOL) 500 MG tablet Take 500 mg by mouth every 6 (six) hours as needed for mild pain.    [provider]  amoxicillin (AMOXIL) 500 MG capsule Take 1 capsule (500 mg total) by mouth 3 (three) times daily. Patient not taking: Reported on 03/05/2017 02/04/17   Triplett,  Tammy, PA-C  doxycycline (VIBRAMYCIN) 100 MG capsule Take 1 capsule (100 mg total) by mouth 2 (two) times daily for 7 days. 01/15/20 01/22/20  Carroll Sage, PA-C  ibuprofen (ADVIL,MOTRIN) 600 MG tablet Take 1 tablet (600 mg total) by mouth every 6 (six) hours as needed. Take with food Patient not taking: Reported on 03/05/2017 02/04/17   Triplett, Tammy, PA-C  metroNIDAZOLE (FLAGYL) 500 MG tablet Take 1 tablet (500 mg total) by mouth 2 (two) times daily. 01/13/20   Burgess Amor, PA-C  oxyCODONE-acetaminophen (ROXICET) 5-325 MG tablet Take 1-2 tablets by mouth every 4 (four) hours as needed for severe pain. Patient not taking: Reported on 03/05/2017  09/09/16   Michaele Offer, DO  Prenat w/o A-FeCbGl-DSS-FA-DHA [facility] ASSURE) 35-1 & 300 MG tablet One tablet and one capsule daily Patient not taking: Reported on 03/05/2017 06/03/16   Adline Potter, NP  sulfamethoxazole-trimethoprim (BACTRIM DS) 800-160 MG tablet Take 1 tablet by mouth 2 (two) times daily for 10 days. 01/13/20 01/23/20  Burgess Amor, PA-C    Allergies    Patient has no known allergies.  Review of Systems   Review of Systems  Constitutional: Negative for chills and fever.  HENT: Negative for congestion and sore throat.   Respiratory: Negative for shortness of breath.   Cardiovascular: Negative for chest pain.  Gastrointestinal: Negative for abdominal pain, diarrhea, nausea and vomiting.  Genitourinary: Positive for dysuria and vaginal discharge. Negative for decreased urine volume, enuresis, flank pain and vaginal bleeding.  Musculoskeletal: Negative for back pain.  Skin: Negative for rash.  Neurological: Negative for headaches.  Hematological: Does not bruise/bleed easily.    Physical Exam Updated Vital Signs BP 125/88 (BP Location: Right Arm)   Pulse 79   Temp 97.8 F (36.6 C) (Oral)   Resp 18   Ht 5\' 6"  (1.676 m)   Wt 63 kg   SpO2 99%   BMI 22.44 kg/m   Physical Exam Vitals and nursing note reviewed.  Constitutional:      General: She is not in acute distress.    Appearance: Normal appearance. She is not ill-appearing or diaphoretic.  HENT:     Head: Normocephalic and atraumatic.     Nose: No congestion.     Mouth/Throat:     Mouth: Mucous membranes are moist.     Pharynx: Oropharynx is clear. No oropharyngeal exudate or posterior oropharyngeal erythema.  Eyes:     Conjunctiva/sclera: Conjunctivae normal.  Cardiovascular:     Rate and Rhythm: Normal rate.     Heart sounds: No murmur heard.  No friction rub. No gallop.   Pulmonary:     Effort: Pulmonary effort is normal. No respiratory distress.     Breath sounds: Normal  breath sounds. No wheezing or rales.  Abdominal:     Palpations: Abdomen is soft.     Tenderness: There is no abdominal tenderness. There is no right CVA tenderness or left CVA tenderness.  Musculoskeletal:     Cervical back: Neck supple.     Right lower leg: No edema.     Left lower leg: No edema.  Skin:    General: Skin is warm and dry.     Coloration: Skin is not jaundiced or pale.     Findings: No lesion or rash.     Comments: Skin exam was performed there is no notable rashes, lacerations, ecchymosis, lesions or other gross abnormalities noted.  Neurological:     Mental Status: She is alert.  Psychiatric:  Mood and Affect: Mood normal.     ED Results / Procedures / Treatments   Labs (all labs ordered are listed, but only abnormal results are displayed) Labs Reviewed  RPR  HIV ANTIBODY (ROUTINE TESTING W REFLEX)    EKG None  Radiology No results found.  Procedures Procedures (including critical care time)  Medications Ordered in ED Medications  cefTRIAXone (ROCEPHIN) injection 500 mg (500 mg Intramuscular Given 01/15/20 1254)  lidocaine (PF) (XYLOCAINE) 1 % injection 1 mL (1 mL Other Given 01/15/20 1255)    ED Course  I have reviewed the triage vital signs and the nursing notes.  Pertinent labs & imaging results that were available during my care of the patient were reviewed by me and considered in my medical decision making (see chart for details).    MDM Rules/Calculators/A&P                          Patient presents with chief complaint of positive gonorrhea test. She was alert, does not appear in acute distress, vital signs reassuring. Will provide her with a shot of Rocephin and start her on outpatient antibiotics.  Low suspicion for pyelonephritis as patient denies lower back pain, nausea vomiting fevers or chills no CVA tenderness on exam. will continue patient's current treatment for her UTI as she states her symptoms are improving and she is  tolerating the medication well.  low suspicion for systemic infection as patient is nontoxic-appearing, vital signs reassuring, no obvious source infection noted on exam.  Patient provided with IM  Rocephin to treat her for gonorrhea.  will also start her on doxycycline for possible gonorrhea resistance and possible chlamydia. Patient is currently on Flagyl for her bacterial vaginosis. Will recommend patient abstain from sexual intercourse for 2 weeks and encourage sex safe practices.   Vital signs have remained stable, no indication for hospital admission.  Patient discussed with attending and they agreed with assessment and plan.  Patient given at home care as well strict return precautions.  Patient verbalized that they understood agreed to said plan.   Final Clinical Impression(s) / ED Diagnoses Final diagnoses:  Gonorrhea  STI (sexually transmitted infection)    Rx / DC Orders ED Discharge Orders         Ordered    doxycycline (VIBRAMYCIN) 100 MG capsule  2 times daily        01/15/20 1318           Barnie Del 01/15/20 1334    Eber Hong, MD 01/17/20 [id number]

## 2020-01-15 NOTE — Discharge Instructions (Addendum)
You were treated for gonorrhea. I have started you on an antibiotic doxycycline please take this as prescribed. Please be aware that this medication can make you more susceptible to sunburn please remember to wear something to protect your face and skin. I need you to abstain from sexual intercourse for the next 2 weeks. Your HIV and syphilis are pending if anything is positive the hospital contact you.  I have given you information for sex safe practices. You can also go to the health department where they screened and tested for STIs.  Come back to the emergency department if you develop chest pain, shortness of breath, severe abdominal pain, uncontrolled nausea, vomiting, diarrhea.

## 2020-01-15 NOTE — ED Triage Notes (Signed)
Pt with positive for gonorrhea.  Pt here for an antibiotic shot.

## 2020-01-16 LAB — RPR: RPR Ser Ql: NONREACTIVE

## 2021-12-31 ENCOUNTER — Encounter (HOSPITAL_COMMUNITY): Payer: Self-pay | Admitting: Emergency Medicine

## 2021-12-31 ENCOUNTER — Emergency Department (HOSPITAL_COMMUNITY)
Admission: EM | Admit: 2021-12-31 | Discharge: 2021-12-31 | Disposition: A | Discharge status: Home / Self Care | Payer: 59 | Attending: Emergency Medicine | Admitting: Emergency Medicine

## 2021-12-31 ENCOUNTER — Other Ambulatory Visit: Payer: Self-pay

## 2021-12-31 DIAGNOSIS — T148XXA Other injury of unspecified body region, initial encounter: Secondary | ICD-10-CM

## 2021-12-31 DIAGNOSIS — M7981 Nontraumatic hematoma of soft tissue: Secondary | ICD-10-CM | POA: Insufficient documentation

## 2021-12-31 NOTE — ED Provider Notes (Signed)
[facility] EMERGENCY DEPARTMENT Provider Note   CSN: [id number] Arrival date & time: 12/31/21  1409     History Chief Complaint  Patient presents with   Leg Injury    Cassandra Berg is a 31 y.o. female patient who presents to the emergency department today for further evaluation of bruising over her legs.  Patient was told by a friend that this could be related to blood clots and she wanted to get evaluated.  She denies any leg pain, recent travel, leg swelling.  HPI     Home Medications Prior to Admission medications   Medication Sig Start Date End Date Taking? Authorizing Provider  acetaminophen (TYLENOL) 500 MG tablet Take 500 mg by mouth every 6 (six) hours as needed for mild pain.    [provider]  amoxicillin (AMOXIL) 500 MG capsule Take 1 capsule (500 mg total) by mouth 3 (three) times daily. Patient not taking: Reported on 03/05/2017 02/04/17   Triplett, Babette Relic, PA-C  ibuprofen (ADVIL,MOTRIN) 600 MG tablet Take 1 tablet (600 mg total) by mouth every 6 (six) hours as needed. Take with food Patient not taking: Reported on 03/05/2017 02/04/17   Triplett, Tammy, PA-C  metroNIDAZOLE (FLAGYL) 500 MG tablet Take 1 tablet (500 mg total) by mouth 2 (two) times daily. 01/13/20   Burgess Amor, PA-C  oxyCODONE-acetaminophen (ROXICET) 5-325 MG tablet Take 1-2 tablets by mouth every 4 (four) hours as needed for severe pain. Patient not taking: Reported on 03/05/2017 09/09/16   Michaele Offer, DO  Prenat w/o A-FeCbGl-DSS-FA-DHA [facility] ASSURE) 35-1 & 300 MG tablet One tablet and one capsule daily Patient not taking: Reported on 03/05/2017 06/03/16   Adline Potter, NP      Allergies    Patient has no known allergies.    Review of Systems   Review of Systems  All other systems reviewed and are negative.   Physical Exam Updated Vital Signs BP (!) 124/94 (BP Location: Right Arm)   Pulse 70   Temp 98.3 F (36.8 C) (Oral)   Resp 20   Ht 5\' 6"  (1.676 m)    Wt 65.8 kg   SpO2 100%   BMI 23.40 kg/m  Physical Exam Vitals and nursing note reviewed.  Constitutional:      Appearance: Normal appearance.  HENT:     Head: Normocephalic and atraumatic.  Eyes:     General:        Right eye: No discharge.        Left eye: No discharge.     Conjunctiva/sclera: Conjunctivae normal.  Pulmonary:     Effort: Pulmonary effort is normal.  Musculoskeletal:     Comments: No leg swelling bilaterally.  Legs are nontender bilaterally.  Skin:    General: Skin is warm and dry.     Findings: No rash.     Comments: 1 silver dollar sized well-healing ecchymosis to the right lateral thigh.  It is nontender to palpation.  Neurological:     General: No focal deficit present.     Mental Status: She is alert.  Psychiatric:        Mood and Affect: Mood normal.        Behavior: Behavior normal.     ED Results / Procedures / Treatments   Labs (all labs ordered are listed, but only abnormal results are displayed) Labs Reviewed - No data to display  EKG None  Radiology No results found.  Procedures Procedures    Medications Ordered in ED  Medications - No data to display  ED Course/ Medical Decision Making/ A&P                           Medical Decision Making Cassandra Berg is a 31 y.o. female patient who presents to the emergency department today for further evaluation of bruising.  I have a low suspicion that this is related to a DVT.  Patient has very little risk factors for DVT.  I did discuss with the patient other etiologies for could be causing the bruising which we could evaluate with blood work.  Patient ultimately declined blood work at this time.  I would like for her to follow-up with her primary care doctor and she can return to the emergency department for any red flag symptoms which we discussed.  She is safe for discharge at this time.    Final Clinical Impression(s) / ED Diagnoses Final diagnoses:  Bruising    Rx / DC  Orders ED Discharge Orders     None         Teressa Lower, [place] 12/31/21 1556    Cathren Laine, MD 12/31/21 1845

## 2021-12-31 NOTE — ED Triage Notes (Signed)
Pt via POV c/o pain and bruise to right thigh, unsure of origin. No known mechanism of injury and no other injuries. Area is sore to touch but otherwise no complaints. No meds PTA.

## 2021-12-31 NOTE — Discharge Instructions (Addendum)
Please follow-up with your primary care doctor for further evaluation.  You may return to the emergency room if any worsening symptoms you might have.  As we discussed, I have a low suspicion right now this is from blood clots.

## 2023-03-29 ENCOUNTER — Encounter (HOSPITAL_COMMUNITY): Payer: Self-pay

## 2023-03-29 ENCOUNTER — Emergency Department (HOSPITAL_COMMUNITY)
Admission: EM | Admit: 2023-03-29 | Discharge: 2023-03-29 | Disposition: A | Discharge status: Home / Self Care | Payer: 59 | Attending: Emergency Medicine | Admitting: Emergency Medicine

## 2023-03-29 ENCOUNTER — Other Ambulatory Visit: Payer: Self-pay

## 2023-03-29 DIAGNOSIS — B349 Viral infection, unspecified: Secondary | ICD-10-CM | POA: Insufficient documentation

## 2023-03-29 DIAGNOSIS — R531 Weakness: Secondary | ICD-10-CM | POA: Diagnosis present

## 2023-03-29 DIAGNOSIS — Z20822 Contact with and (suspected) exposure to covid-19: Secondary | ICD-10-CM | POA: Diagnosis not present

## 2023-03-29 LAB — SARS CORONAVIRUS 2 BY RT PCR: SARS Coronavirus 2 by RT PCR: NEGATIVE

## 2023-03-29 MED ORDER — ONDANSETRON HCL 4 MG PO TABS: 4.0000 mg | ORAL_TABLET | Freq: Four times a day (QID) | ORAL | 0 refills | Status: AC

## 2023-03-29 MED ORDER — ACETAMINOPHEN 325 MG PO TABS
650.0000 mg | ORAL_TABLET | Freq: Once | ORAL | Status: AC
  Administered 2023-03-29: 650 mg via ORAL

## 2023-03-29 NOTE — ED Triage Notes (Signed)
Pt presents to ED with complaints of fatigue, cough, body aches since yesterday

## 2023-03-29 NOTE — ED Provider Notes (Signed)
 [place] EMERGENCY DEPARTMENT AT [facility] Provider Note   CSN: [id number] Arrival date & time: 03/29/23  9191     History  No chief complaint on file.   Cassandra Berg is a 33 y.o. female with no pertinent past medical history presented with URI-like symptoms.  Patient states that her daughter is sick with the same symptoms however she has been sick for only 1 day.  Patient has been able to eat and drink without issue.  Patient denies fevers, chest pain, shortness of breath but does state that she is feels generally weak.  Patient does not take any medications yet.  Home Medications Prior to Admission medications   Medication Sig Start Date End Date Taking? Authorizing Provider  ondansetron  (ZOFRAN ) 4 MG tablet Take 1 tablet (4 mg total) by mouth every 6 (six) hours. 03/29/23  Yes Latham Kinzler, Lynwood DASEN, PA-C  acetaminophen  (TYLENOL ) 500 MG tablet Take 500 mg by mouth every 6 (six) hours as needed for mild pain.    [provider]  amoxicillin  (AMOXIL ) 500 MG capsule Take 1 capsule (500 mg total) by mouth 3 (three) times daily. Patient not taking: Reported on 03/05/2017 02/04/17   Triplett, Tammy, PA-C  ibuprofen  (ADVIL ,MOTRIN ) 600 MG tablet Take 1 tablet (600 mg total) by mouth every 6 (six) hours as needed. Take with food Patient not taking: Reported on 03/05/2017 02/04/17   Triplett, Tammy, PA-C  metroNIDAZOLE  (FLAGYL ) 500 MG tablet Take 1 tablet (500 mg total) by mouth 2 (two) times daily. 01/13/20   Idol, Julie, PA-C  oxyCODONE -acetaminophen  (ROXICET) 5-325 MG tablet Take 1-2 tablets by mouth every 4 (four) hours as needed for severe pain. Patient not taking: Reported on 03/05/2017 09/09/16   Sampson Almarie Flaming, DO  Prenat w/o A-FeCbGl-DSS-FA-DHA (CITRANATAL  ASSURE) 35-1 & 300 MG tablet One tablet and one capsule daily Patient not taking: Reported on 03/05/2017 06/03/16   Signa Delon LABOR, NP      Allergies    Patient has no known allergies.    Review of  Systems   Review of Systems  Physical Exam Updated Vital Signs BP 115/74   Pulse (!) 101   Temp 99.8 F (37.7 C) (Oral)   Resp 14   Ht 5' 6 (1.676 m)   Wt 74.4 kg   LMP 03/22/2023   SpO2 98%   BMI 26.47 kg/m  Physical Exam Vitals reviewed.  Constitutional:      General: She is not in acute distress. HENT:     Head: Normocephalic and atraumatic.  Eyes:     Extraocular Movements: Extraocular movements intact.     Conjunctiva/sclera: Conjunctivae normal.     Pupils: Pupils are equal, round, and reactive to light.  Cardiovascular:     Rate and Rhythm: Normal rate and regular rhythm.     Pulses: Normal pulses.     Heart sounds: Normal heart sounds.     Comments: 2+ bilateral radial/dorsalis pedis pulses with regular rate Pulmonary:     Effort: Pulmonary effort is normal. No respiratory distress.     Breath sounds: Normal breath sounds.  Abdominal:     Palpations: Abdomen is soft.     Tenderness: There is no abdominal tenderness. There is no guarding or rebound.  Musculoskeletal:        General: Normal range of motion.     Cervical back: Normal range of motion and neck supple.     Comments: 5 out of 5 bilateral grip/leg extension strength  Skin:  General: Skin is warm and dry.     Capillary Refill: Capillary refill takes less than 2 seconds.  Neurological:     General: No focal deficit present.     Mental Status: She is alert and oriented to person, place, and time.     Comments: Sensation intact in all 4 limbs  Psychiatric:        Mood and Affect: Mood normal.     ED Results / Procedures / Treatments   Labs (all labs ordered are listed, but only abnormal results are displayed) Labs Reviewed  SARS CORONAVIRUS 2 BY RT PCR  RESP PANEL BY RT-PCR (RSV, FLU A&B, COVID)  [id number]    EKG None  Radiology No results found.  Procedures Procedures    Medications Ordered in ED Medications  acetaminophen  (TYLENOL ) tablet 650 mg (has no administration in time  range)    ED Course/ Medical Decision Making/ A&P                                 Medical Decision Making Risk OTC drugs. Prescription drug management.   Cassandra Berg 33 y.o. presented today for URI like symptoms. Working DDx that I considered at this time includes, but not limited to, viral illness, pharyngitis, mono, sinusitis, electrolyte abnormality, AOM, pneumonia.  R/o DDx: pharyngitis, mono, sinusitis, electrolyte abnormality, AOM, pneumonia: these diagnoses are not consistent with patient's history, presentation, physical exam, labs/imaging findings.  Review of prior external notes: 12/31/2021 ED  Unique Tests and My Independent Interpretation:  Respiratory panel: Pending COVID: Negative  Social Determinants of Health: none  Discussion with Independent Historian:  Daughter  Discussion of Management of Tests: None  Risk: Medium: prescription drug management  Risk Stratification Score: None Plan: On exam patient was no acute distress with stable vitals.  Patient's vitals and was largely unremarkable and her daughter tested positive for the flu earlier today and so I do suspect this is what is going on.  I counseled the patient on using Tylenol  or Profen every 6 hours needed for pain remain hydrated.  Patient not had any emesis but will prescribe Zofran  in case this changes.  I encouraged patient to remain hydrated food as tolerated follow-up with her primary care provider.  Patient was p.o. challenged and is safe to be discharged at this time follow-up outpatient.  Patient is not requiring any oxygen and has not required any oxygen at any point during the ED stay.  Will discharge with work note.  Patient is a young healthy 33 year old that is not a high risk patient does not require Tamiflu.  Patient agrees with this.  Patient was successfully p.o. challenge and is safe to be discharged.  Encourage patient to remain hydrated and take Tylenol  ibuprofen  every 6 hours as  needed for pain.  Will prescribe Zofran  in case she develops any nausea at home due to her viral illness.  Patient was given return precautions.patient stable for discharge at this time.  Patient verbalized understanding of plan.  This chart was dictated using voice recognition software.  Despite best efforts to proofread,  errors can occur which can change the documentation meaning.         Final Clinical Impression(s) / ED Diagnoses Final diagnoses:  Viral illness    Rx / DC Orders ED Discharge Orders          Ordered    ondansetron  (ZOFRAN ) 4 MG tablet  Every  6 hours        03/29/23 1154              Victor Lynwood ONEIDA DEVONNA 03/29/23 1456    Yolande Lamar BROCKS, MD 03/31/23 [phone]

## 2023-03-29 NOTE — Discharge Instructions (Signed)
 Please follow-up with your primary care provider regards recent ER visit. Today your labs and imaging are reassuring most likely have a viral illness. Please use Tylenol  or ibuprofen  every 6 hours as needed for pain. Please use the Zofran  I prescribed as needed for any nausea might have at home. Please remain hydrated and eat food as tolerated. If symptoms change or worsen please return to the ER.
